# Patient Record
Sex: Female | Born: 1980 | Race: Black or African American | Hispanic: No | Marital: Single | Smoking: Never smoker
Health system: Southern US, Community
[De-identification: ages and names within clinical notes are randomized; demographics above are authoritative.]

## PROBLEM LIST (undated history)

## (undated) DIAGNOSIS — R32 Unspecified urinary incontinence: Secondary | ICD-10-CM

## (undated) HISTORY — DX: Unspecified urinary incontinence: R32

---

## 2011-06-22 DIAGNOSIS — R51 Headache: Secondary | ICD-10-CM | POA: Diagnosis not present

## 2011-10-05 DIAGNOSIS — R32 Unspecified urinary incontinence: Secondary | ICD-10-CM | POA: Diagnosis not present

## 2011-10-05 DIAGNOSIS — K5900 Constipation, unspecified: Secondary | ICD-10-CM | POA: Diagnosis not present

## 2011-10-05 DIAGNOSIS — F840 Autistic disorder: Secondary | ICD-10-CM | POA: Diagnosis not present

## 2012-04-11 DIAGNOSIS — F840 Autistic disorder: Secondary | ICD-10-CM | POA: Diagnosis not present

## 2012-04-11 DIAGNOSIS — K5900 Constipation, unspecified: Secondary | ICD-10-CM | POA: Diagnosis not present

## 2012-04-11 DIAGNOSIS — Z23 Encounter for immunization: Secondary | ICD-10-CM | POA: Diagnosis not present

## 2012-04-11 DIAGNOSIS — Z0000 Encounter for general adult medical examination without abnormal findings: Secondary | ICD-10-CM | POA: Diagnosis not present

## 2013-11-19 ENCOUNTER — Ambulatory Visit
Admission: RE | Admit: 2013-11-19 | Discharge: 2013-11-19 | Disposition: A | Discharge status: Home / Self Care | Payer: Medicare Other | Source: Ambulatory Visit | Attending: Internal Medicine | Admitting: Internal Medicine

## 2013-11-19 ENCOUNTER — Other Ambulatory Visit: Payer: Self-pay | Admitting: Internal Medicine

## 2013-11-19 DIAGNOSIS — R05 Cough: Secondary | ICD-10-CM

## 2013-11-19 DIAGNOSIS — R059 Cough, unspecified: Secondary | ICD-10-CM | POA: Diagnosis not present

## 2013-11-19 DIAGNOSIS — J189 Pneumonia, unspecified organism: Secondary | ICD-10-CM | POA: Diagnosis not present

## 2013-11-28 DIAGNOSIS — R1310 Dysphagia, unspecified: Secondary | ICD-10-CM | POA: Diagnosis not present

## 2013-11-28 DIAGNOSIS — J189 Pneumonia, unspecified organism: Secondary | ICD-10-CM | POA: Diagnosis not present

## 2013-12-05 ENCOUNTER — Other Ambulatory Visit (HOSPITAL_COMMUNITY): Payer: Self-pay | Admitting: Internal Medicine

## 2013-12-05 DIAGNOSIS — R1310 Dysphagia, unspecified: Secondary | ICD-10-CM

## 2013-12-06 ENCOUNTER — Other Ambulatory Visit: Payer: Self-pay | Admitting: Internal Medicine

## 2013-12-06 ENCOUNTER — Ambulatory Visit
Admission: RE | Admit: 2013-12-06 | Discharge: 2013-12-06 | Disposition: A | Discharge status: Home / Self Care | Payer: Medicare Other | Source: Ambulatory Visit | Attending: Internal Medicine | Admitting: Internal Medicine

## 2013-12-06 DIAGNOSIS — J189 Pneumonia, unspecified organism: Secondary | ICD-10-CM | POA: Diagnosis not present

## 2013-12-06 DIAGNOSIS — R059 Cough, unspecified: Secondary | ICD-10-CM | POA: Diagnosis not present

## 2013-12-06 DIAGNOSIS — J90 Pleural effusion, not elsewhere classified: Secondary | ICD-10-CM | POA: Diagnosis not present

## 2013-12-06 DIAGNOSIS — R05 Cough: Secondary | ICD-10-CM

## 2013-12-13 ENCOUNTER — Ambulatory Visit (HOSPITAL_COMMUNITY)
Admission: RE | Admit: 2013-12-13 | Discharge: 2013-12-13 | Disposition: A | Discharge status: Home / Self Care | Payer: Medicare Other | Source: Ambulatory Visit | Attending: Internal Medicine | Admitting: Internal Medicine

## 2013-12-13 DIAGNOSIS — R1310 Dysphagia, unspecified: Secondary | ICD-10-CM

## 2013-12-13 NOTE — Procedures (Signed)
Objective Swallowing Evaluation: Modified Barium Swallowing Study  Patient Details  Name: Jacqueline Hebert MRN: [id number] Date of Birth: [birth year]  Today's Date: 12/13/2013 Time: 1300-1326 SLP Time Calculation (min): 26 min  Past Medical History: No past medical history on file. Past Surgical History: No past surgical history on file. HPI:  33 yo female with h/o Autism, MR, urinary incontinence presenting for MBS due to concerns pt may be aspirating.  Pt has h/o right lower lobe pna on recent CXR.  Adoptive mother *grandmother present and reports pt takes medications crushed with applesauce and doesn't chew food well but does not normally cough when eating/drinking.  She admits to pt having congested cough recently.  Pt without significant weight loss and can normally feed herself.  She was observed to ambulate to and from xray suite.      Assessment / Plan / Recommendation Clinical Impression  Dysphagia Diagnosis: Mild oral phase dysphagia  Clinical impression: Mild oral phase dysphagia pt with decreased mastication abilities with pt appearing to suckle cracker without fully masticating.   In addition, pt partially masticated barium tablet that was given with pudding.  Pharyngeal stasis of piece of tablet noted in pyriform sinus that effectively cleared with slp providing pt with more pudding to swallow.  Pt did not aspirate/penetrate with any consistency swallowed and overall swallow was strong/timely.   Pt is impulsive and takes large boluses but was protective of her airway.    Educated pt's adoptive mother/caregiver to findings and strategies to maximize pt's airway protection due to episodic aspiration risk with oral deficits.   Potential diet modifications (soft) also reviewed.  Thanks for this referral.        Treatment Recommendation   No further SLP   Diet Recommendation Dysphagia 3 (Mechanical Soft);Thin liquid   Liquid Administration via: Cup Medication Administration: Crushed  with puree Supervision: Patient able to self feed Compensations: Slow rate;Small sips/bites;Follow solids with liquid Postural Changes and/or Swallow Maneuvers: Seated upright 90 degrees;Upright 30-60 min after meal    Other  Recommendations Oral Care Recommendations: Oral care BID   Follow Up Recommendations  None      General Date of Onset: 12/13/13 HPI: 33 yo female with h/o Autism, MR, urinary incontinence presenting for MBS due to concerns pt may be aspirating.  Pt has h/o right lower lobe pna on recent CXR.  Adoptive mother *grandmother present and reports pt takes medications crushed with applesauce and doesn't chew food well but does not normally cough when eating/drinking.  She admits to pt having congested cough recently.  Pt without significant weight loss and can normally feed herself.  She was observed to ambulate to and from xray suite.  Type of Study: Modified Barium Swallowing Study Reason for Referral: Objectively evaluate swallowing function Diet Prior to this Study: Regular;Thin liquids Temperature Spikes Noted: No Respiratory Status: Room air History of Recent Intubation: No Behavior/Cognition: Alert;Doesn't follow directions (pt has MR and did not follow directions for OME, dentition intact) Oral Cavity - Dentition: Adequate natural dentition Oral Motor / Sensory Function: Within functional limits Self-Feeding Abilities: Able to feed self Patient Positioning: Upright in chair Baseline Vocal Quality: Aphonic (adoptive mother 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) Volitional Cough: Cognitively unable to elicit Volitional Swallow: Unable to elicit Anatomy: Within functional limits Pharyngeal Secretions: Not observed secondary MBS    Reason for Referral Objectively evaluate swallowing function   Oral Phase Oral Preparation/Oral Phase Oral Phase: Impaired Oral - Nectar Oral - Nectar Cup: Within functional limits Oral -  Thin Oral - Thin Cup: Within functional limits Oral - Thin  Straw: Within functional limits Oral - Solids Oral - Puree: Within functional limits Oral - Regular: Impaired mastication Oral - Pill: Piecemeal swallowing;Weak lingual manipulation Oral Phase - Comment Oral Phase - Comment: pt with decreased mastication abilities, appearing to suckle cracker without full mastication, partially masticated barium tablet given with pudding before swallowing   Pharyngeal Phase Pharyngeal Phase Pharyngeal Phase: Impaired Pharyngeal - Nectar Pharyngeal - Nectar Cup: Within functional limits Pharyngeal - Thin Pharyngeal - Thin Cup: Within functional limits Pharyngeal - Thin Straw: Within functional limits Pharyngeal - Solids Pharyngeal - Puree: Within functional limits Pharyngeal - Regular: Premature spillage to valleculae;Within functional limits Pharyngeal - Pill: Pharyngeal residue - pyriform sinuses;Premature spillage to valleculae;Within functional limits Pharyngeal Phase - Comment Pharyngeal Comment: partially masticated barium tablet residuals with pudding noted to be retained in pyriform sinus and below UES without awareness, providing applesauce aided clearance  Cervical Esophageal Phase    GO    Cervical Esophageal Phase Cervical Esophageal Phase: Impaired Cervical Esophageal Phase - Comment Cervical Esophageal Comment: pt appeared with minimal amount of stasis below UES with pudding and barium tablet, swallow of thin barium effective to aid clearance     Functional Assessment Tool Used: mbs, clinical judgement Functional Limitations: Swallowing Swallow Current Status ([id number]): At least 1 percent but less than 20 percent impaired, limited or restricted Swallow Goal Status [phone]): At least 1 percent but less than 20 percent impaired, limited or restricted Swallow Discharge Status [phone]): At least 1 percent but less than 20 percent impaired, limited or restricted    Claudie Fisherman, [facility] SLP [phone]

## 2014-01-01 ENCOUNTER — Ambulatory Visit: Payer: Medicare Other

## 2014-04-16 ENCOUNTER — Other Ambulatory Visit: Payer: Self-pay | Admitting: Nurse Practitioner

## 2014-04-16 ENCOUNTER — Ambulatory Visit
Admission: RE | Admit: 2014-04-16 | Discharge: 2014-04-16 | Disposition: A | Discharge status: Home / Self Care | Payer: Medicare Other | Source: Ambulatory Visit | Attending: Nurse Practitioner | Admitting: Nurse Practitioner

## 2014-04-16 DIAGNOSIS — M79671 Pain in right foot: Secondary | ICD-10-CM

## 2014-04-16 DIAGNOSIS — M7989 Other specified soft tissue disorders: Secondary | ICD-10-CM | POA: Diagnosis not present

## 2014-05-26 DIAGNOSIS — J3489 Other specified disorders of nose and nasal sinuses: Secondary | ICD-10-CM | POA: Diagnosis not present

## 2014-06-02 DIAGNOSIS — J3489 Other specified disorders of nose and nasal sinuses: Secondary | ICD-10-CM | POA: Diagnosis not present

## 2014-06-02 DIAGNOSIS — D2239 Melanocytic nevi of other parts of face: Secondary | ICD-10-CM | POA: Diagnosis not present

## 2014-07-04 DIAGNOSIS — R1310 Dysphagia, unspecified: Secondary | ICD-10-CM | POA: Diagnosis not present

## 2014-07-04 DIAGNOSIS — Z0001 Encounter for general adult medical examination with abnormal findings: Secondary | ICD-10-CM | POA: Diagnosis not present

## 2014-07-04 DIAGNOSIS — Z1389 Encounter for screening for other disorder: Secondary | ICD-10-CM | POA: Diagnosis not present

## 2014-07-04 DIAGNOSIS — K5900 Constipation, unspecified: Secondary | ICD-10-CM | POA: Diagnosis not present

## 2014-07-04 DIAGNOSIS — R32 Unspecified urinary incontinence: Secondary | ICD-10-CM | POA: Diagnosis not present

## 2014-07-04 DIAGNOSIS — F840 Autistic disorder: Secondary | ICD-10-CM | POA: Diagnosis not present

## 2015-01-05 ENCOUNTER — Ambulatory Visit: Payer: Medicare Other | Admitting: Internal Medicine

## 2015-02-05 ENCOUNTER — Ambulatory Visit (INDEPENDENT_AMBULATORY_CARE_PROVIDER_SITE_OTHER): Payer: Medicare Other | Admitting: Internal Medicine

## 2015-02-05 ENCOUNTER — Encounter: Payer: Self-pay | Admitting: Internal Medicine

## 2015-02-05 VITALS — BP 122/78 | HR 79 | Temp 97.8°F | Resp 12 | Ht 62.0 in | Wt 119.1 lb

## 2015-02-05 DIAGNOSIS — Z0000 Encounter for general adult medical examination without abnormal findings: Secondary | ICD-10-CM | POA: Insufficient documentation

## 2015-02-05 DIAGNOSIS — Z23 Encounter for immunization: Secondary | ICD-10-CM

## 2015-02-05 DIAGNOSIS — F79 Unspecified intellectual disabilities: Secondary | ICD-10-CM | POA: Insufficient documentation

## 2015-02-05 NOTE — Progress Notes (Signed)
Pre visit review using our clinic review tool, if applicable. No additional management support is needed unless otherwise documented below in the visit note. 

## 2015-02-05 NOTE — Progress Notes (Signed)
   Subjective:    Patient ID: LEO WEYANDT, female    DOB: Aug 23, 1980, 34 y.o.   MRN: [id number]  HPI The patient is a new 34 YO female coming in for wellness. She does have PMH of mental impairment and does not speak. She is living with her legal guardian. Not able to provide history but no concerns from her caregiver.  PMH, [facility], social history reviewed and updated.   Review of Systems  Unable to perform ROS: Patient nonverbal      Objective:   Physical Exam  Constitutional: She appears well-developed and well-nourished. No distress.  HENT:  Head: Normocephalic and atraumatic.  Eyes: EOM are normal.  Neck: Normal range of motion.  Cardiovascular: Normal rate and regular rhythm.   Pulmonary/Chest: Effort normal and breath sounds normal. No respiratory distress. She has no wheezes.  Abdominal: Soft. Bowel sounds are normal. She exhibits no distension. There is no tenderness.  Neurological:  Does not follow commands, non-verbal  Skin: Skin is warm and dry.   Filed Vitals:   02/05/15 1050  BP: 122/78  Pulse: 79  Temp: 97.8 F (36.6 C)  TempSrc: Oral  Resp: 12  Height: 5\' 2"  (1.575 m)  Weight: 119 lb 1.3 oz (54.014 kg)  SpO2: 98%      Assessment & Plan:  Flu shot given at the visit.

## 2015-02-05 NOTE — Patient Instructions (Signed)
We have given you the flu shot today and can see you back in about 6 months.

## 2015-02-05 NOTE — Assessment & Plan Note (Signed)
Non-verbal although her caregiver is able to understand if something is bothering her.

## 2015-02-05 NOTE — Assessment & Plan Note (Signed)
Pap smears are traumatic and has had at least 1 normal in the past. Not sexually active. Reminded about sunscreen when outdoors and working on activity daily. Will get records of labs from prior provider and no labs today. Flu shot given today.

## 2015-02-11 IMAGING — CR DG CHEST 2V
2 series · 2 of 2 positions shown · non-contrast
Comparison: None.

CLINICAL DATA: Cough, fever, chest congestion

EXAM:
CHEST  2 VIEW

[view not recorded (1 of 2)]
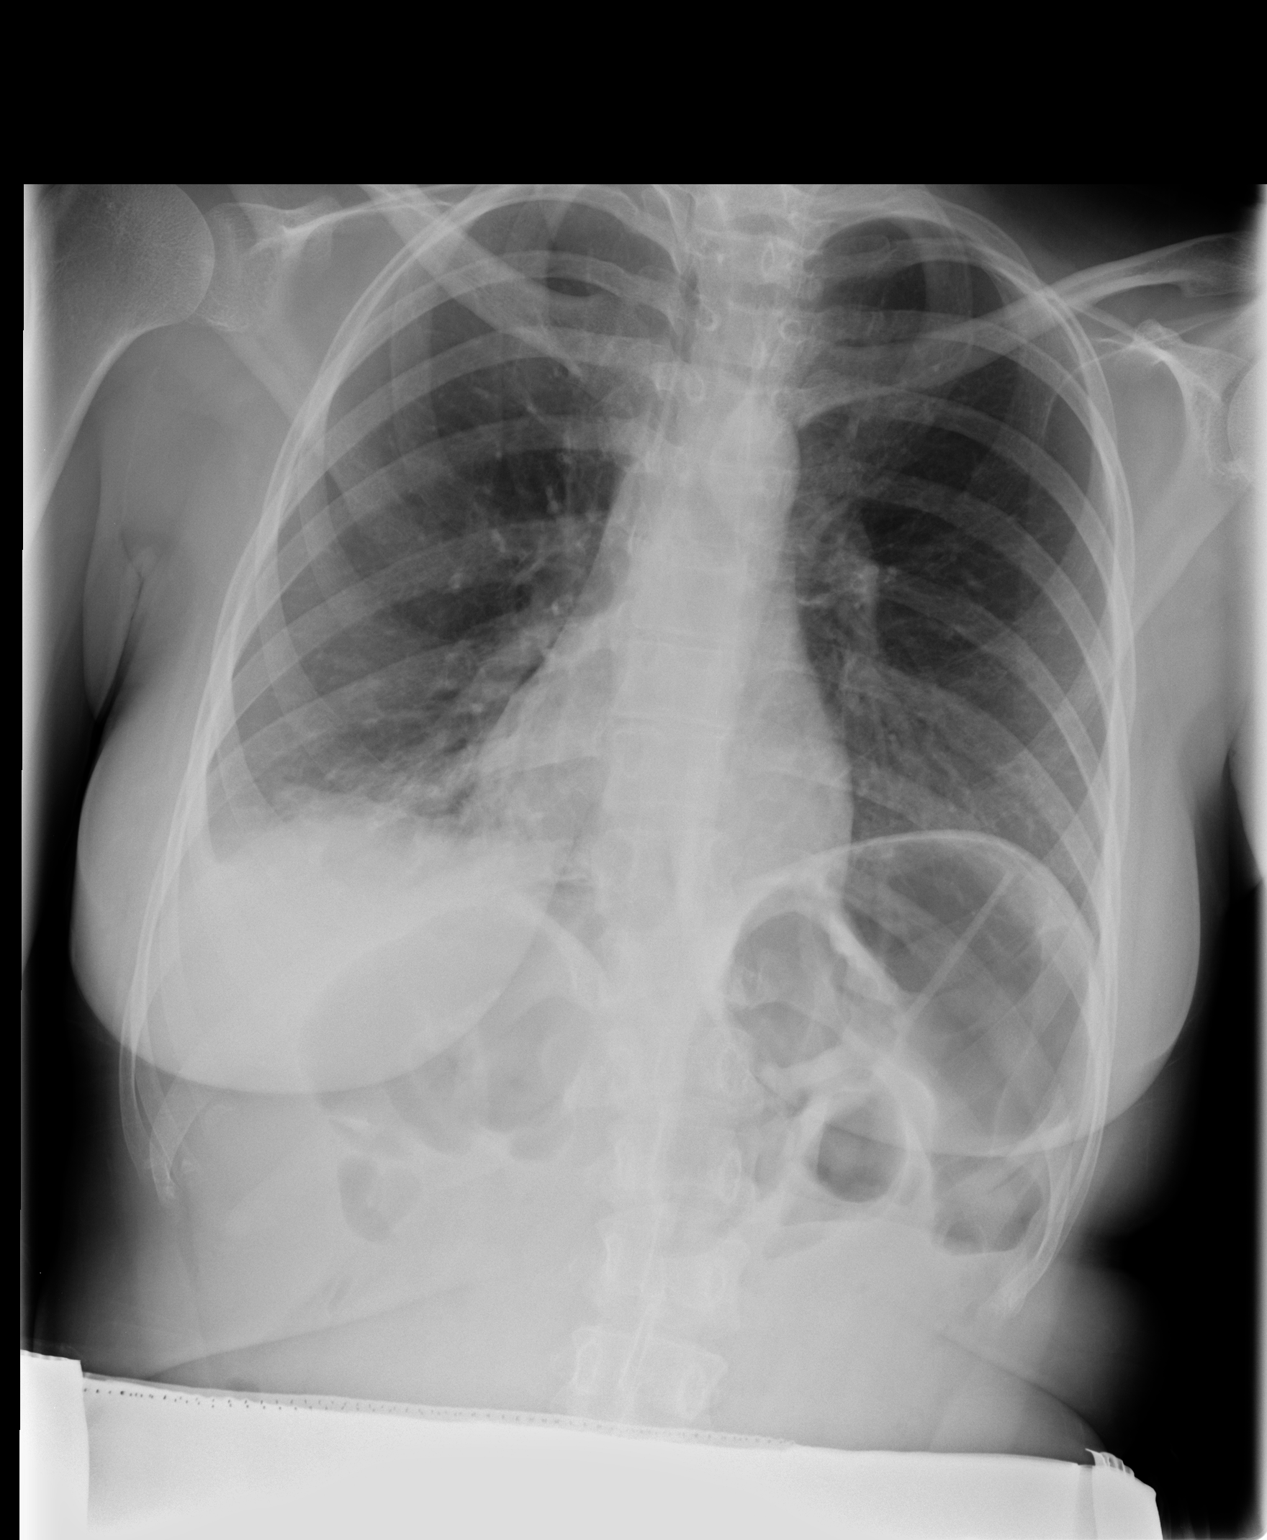

[view not recorded (2 of 2)]
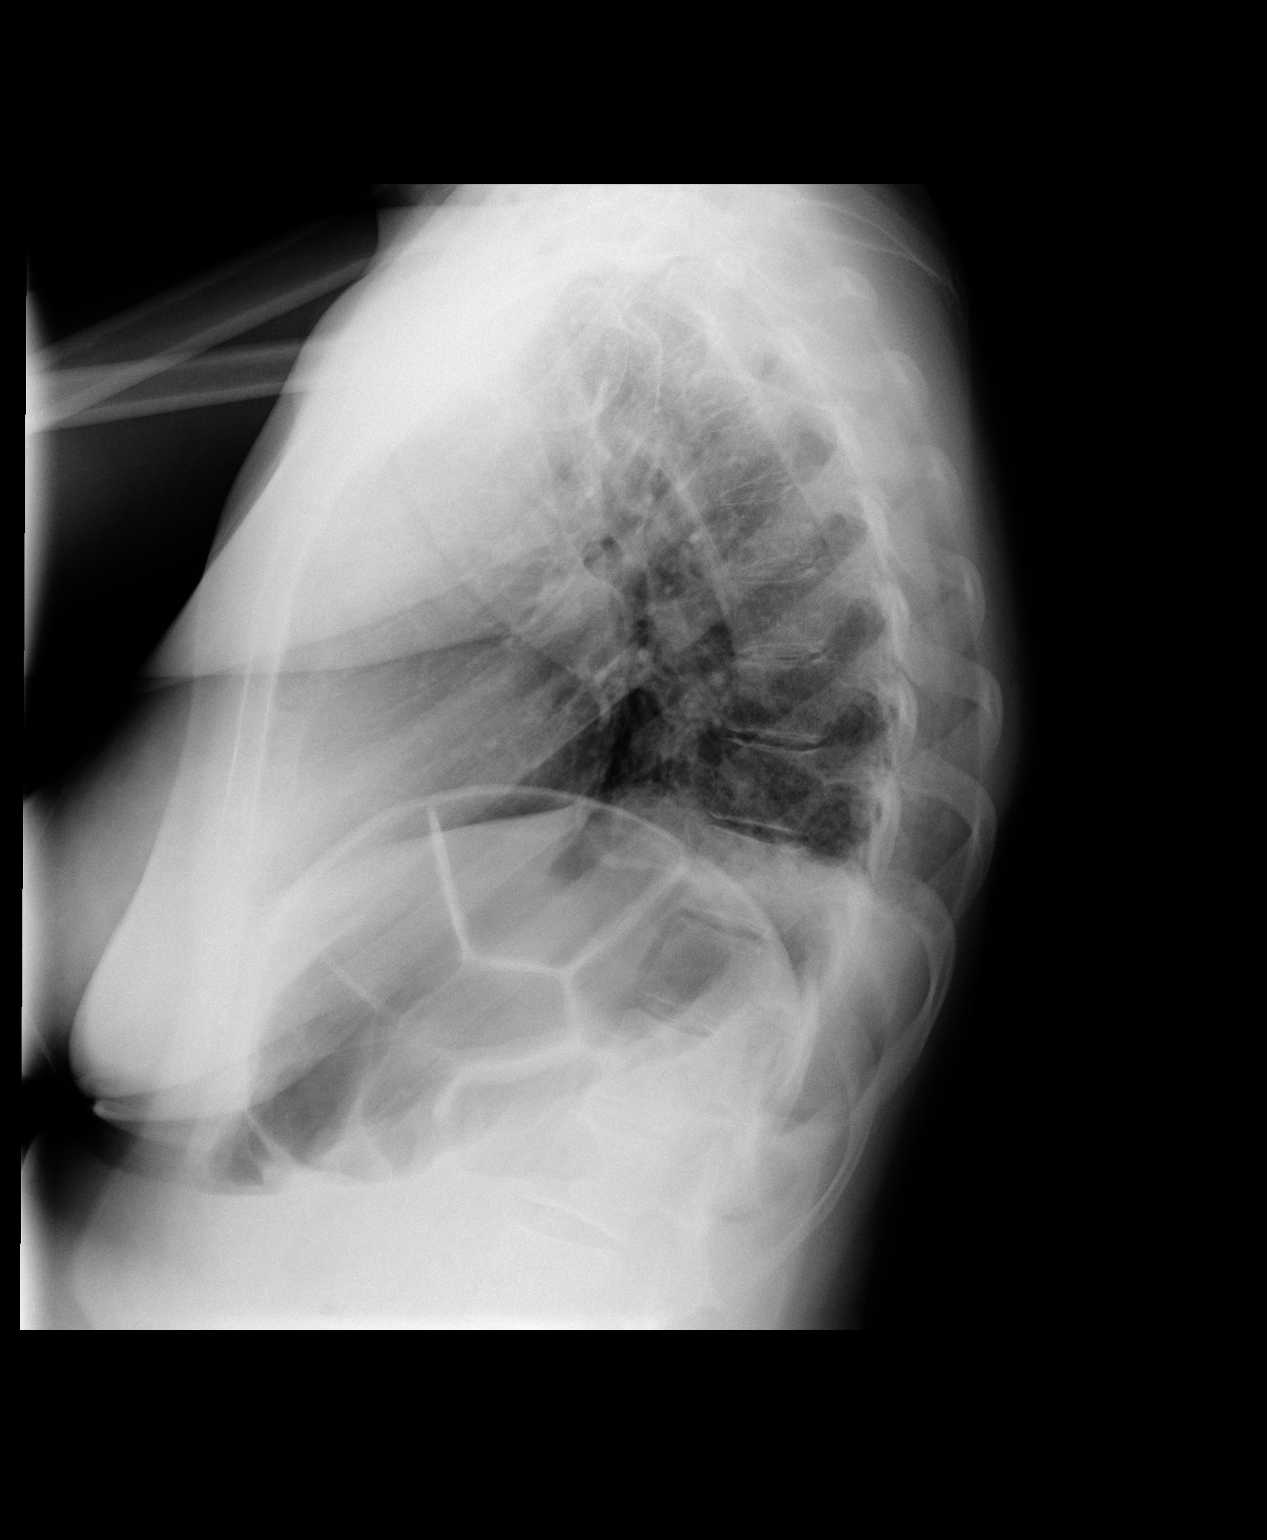

[2 of 2 positions shown; findings below may reference images not displayed]

FINDINGS: There is parenchymal opacity deep at the right lung base suspicious
for pneumonia and possibly a small right pleural effusion. The left
lung is clear. Mediastinal contours appear normal. The heart is
within normal limits in size.
IMPRESSION: Opacity in the right lower lobe consistent with pneumonia and
possible small right pleural effusion.

## 2015-04-07 ENCOUNTER — Telehealth: Payer: Self-pay

## 2015-04-07 NOTE — Telephone Encounter (Signed)
Call back from mother of patient to schedule for AWV; Jayni is on Medicare as well; Will fup on AWV for [name]; POA is in place;  May be review for needs and LTC planning/ will hold tentative apt for Sha[place]rea on Friday 1/11 at 11am

## 2015-04-10 ENCOUNTER — Ambulatory Visit (INDEPENDENT_AMBULATORY_CARE_PROVIDER_SITE_OTHER): Payer: Medicare Other

## 2015-04-10 ENCOUNTER — Ambulatory Visit: Payer: Medicare Other

## 2015-04-10 VITALS — BP 120/80 | HR 95 | Temp 98.2°F | Ht 63.0 in | Wt 121.5 lb

## 2015-04-10 DIAGNOSIS — Z0000 Encounter for general adult medical examination without abnormal findings: Secondary | ICD-10-CM

## 2015-04-10 NOTE — Progress Notes (Signed)
Subjective:   Jacqueline Hebert is a 34 y.o. female who presents for Medicare Annual (INITIAL)  preventive examination.  Review of Systems:     HRA assessment completed during visit; Jacqueline Hebert The Patient was informed that this wellness visit is to identify risk and educate on how to reduce risk for increase disease through lifestyle changes.   ROS deferred to CPE exam with physician/ Changing care to Dr. Amedeo Hebert and legal guardian is Jacqueline Hebert; AD on file Jacqueline. Jacqueline Hebert has permission to speak for her dtr who is developmentally disabled and cannot communicate.    Medical issues  BMI: 21.4 Diet;  Jacqueline Hebert will come in the kitchen and sit at the table when she is hunger Mother Cooks meal for her; some type of vegetable; baked potato; hamburgers; chicken;  Fruits; apples; oranges; Jacqueline Hebert eats well;  Exercise; she has play items at home; go to Jacqueline Hebert recreation; loves to walk Recommended the [employer] and the mother will call to discern activities that Jacqueline Hebert could enjoy; Also to stimulate socialization   SAFETY is an issue; had to put fence around the house; Neighbor has pit bull  Not the safest area but home is purchased / can't use deck because the dogs dig under the fence;  Afraid of neighbor's dogs; hx of attempts to break in; Adult day for DD is not appropriate with history Advised to call the police if neighbor's dog crosses the year or digs under her fence;  Will also outreach the [facility] for any legal assistance that may be helpful  Is across the hall from mother; sleeps by herself;  Routine; Jacqueline Hebert will wake Jacqueline Hebert up; she knows to go to bathroom herself; Dependent on all ADLs and IADL's  No hx of falls  Safety reviewed for the home; clear paths through the home, bathroom safety is an issue; ; smoke detectors in the home; firearms safety discussed and to any firearms  locked up;  Driving accidents / not driving accidents Sun protection advised when out   Medication review/ no  new meds; takes miralax as needed  Fall assessment no falls   Mobilization and Functional losses in the last year./ no Sleep patterns/ sleeps good; but will wake up in the early am.   Long term Plan  is that Jacqueline Hebert would be cared for by [name], her sister when and if Mrs. Cast is not able to provider care Dtr  Lives approx 10 minutes apart Moved From [place] in 2006;    Urinary or fecal incontinence reviewed/ no issues   Counseling: Colonoscopy; to early;  EKG: no record Hearing: unable to determine Dexa/ not due Mammagram/ not able to do this; could not tolerate Ophthalmology exam; no eye exam recently due to not being able to tolerate exam Cervical cancer screening not completed because Jacqueline Hebert could not tolerate Lives with mother and is not sexually active   Immunizations Due  Tetanus unspecified; Home aid  stepped on [name]'s foot and she was taken to the doctor and given a TD this year. It was during the summer, but is not sure which she had TD or Tdap.     Current Care Team reviewed and updated [facility]         Objective:     Vitals: BP 120/80 mmHg  Pulse 95  Temp(Src) 98.2 F (36.8 C) (Oral)  Ht 5\' 3"  (1.6 m)  Wt 121 lb 8 oz (55.112 kg)  BMI 21.53 kg/m2  SpO2  99%  Tobacco History  Smoking status  . Never Smoker   Smokeless tobacco  . Not on file     Counseling given: Yes   Past Medical History  Diagnosis Date  . Incontinence of urine    No past surgical history on file. Family History  Problem Relation Age of Onset  . Family history unknown: Yes   History  Sexual Activity  . Sexual Activity: Not on file    Outpatient Encounter Prescriptions as of 04/10/2015  Medication Sig  . polyethylene glycol powder (GLYCOLAX/MIRALAX) powder Take by mouth daily.    No facility-administered encounter medications on file as of 04/10/2015.    Activities of Daily Living In your present state of health, do you have any difficulty  performing the following activities: 04/10/2015  Hearing? N  Vision? N  Difficulty concentrating or making decisions? Y  Walking or climbing stairs? Y  Dressing or bathing? Y  Doing errands, shopping? Y  Preparing Food and eating ? Y  Using the Toilet? Y  In the past six months, have you accidently leaked urine? Y  Do you have problems with loss of bowel control? Y  Managing your Medications? Y  Managing your Finances? Y  Housekeeping or managing your Housekeeping? Y    Patient Care Team: [name], MD as PCP - General (Internal Medicine)    Assessment:    Assessment   Patient presents for yearly preventative medicine examination. Medicare questionnaire screening were completed, i.e. Functional; fall risk; depression, memory loss and hearing. / is dependent in all ADLs.  All immunizations and health maintenance protocols were reviewed with the patient can not tolerate Pap or mammogram; Could not tolerate eye exam;   Education provided for laboratory screens;    Medication reconciliation, past medical history, social history, problem list and allergies were reviewed in detail with the patient/ referral to [facility] for advocacy and socialization  Goals were established with regard to the patient's individualized risk; In this case, the family in need of advocacy and resources.   End of life planning was discussed and has been completed with HCPOA in place as well a guardian ship    Exercise Activities and Dietary recommendations Current Exercise Habits:: Exercise is limited by, Limited by:: Other - see comments (DD; but likes to walk)  Goals    . patient     Dtr will provide care when Jacqueline. Pamer is no longer able;        Fall Risk Fall Risk  04/10/2015  Falls in the past year? No   Depression Screen PHQ 2/9 Scores 04/10/2015  PHQ - 2 Score 0   Mother states mood is good all the time; Very seldom is tearful, agitated, angry; or has inappropriate  behavior  Cognitive Testing MMSE - Mini Mental State Exam 04/10/2015  Not completed: (No Data)  not able to perform  Immunization History  Administered Date(s) Administered  . Influenza,inj,Quad PF,36+ Mos 02/05/2015  . Tetanus 11/28/2014   Screening Tests Health Maintenance  Topic Date Due  . HIV Screening  01/25/1994  . TETANUS/TDAP  01/25/1998  . PAP SMEAR  01/26/2000  . INFLUENZA VACCINE  12/29/2015      Plan:   [facility] [employer] [facility] [place]; [address] [phone] [email or web address] Mentioned day care services or socialization for [facility]  CAP; is on waiting list  Will check on Anelisse' vaccinations;    During the course of the visit the patient was educated and counseled about the following appropriate  screening and preventive services:   Vaccines to include Pneumoccal, Influenza, Hepatitis B, Td, Zostavax, HCV/  To fup with dr. Delfina Redwood for specific's   Electrocardiogram/ deferred  Cardiovascular Disease/ BP was low but improving  Colorectal cancer screening/ not age appropirate  Bone density screening/ deferred  Diabetes screening/ deferred  Glaucoma screening/ eye exam deferred due to poor tolerance  Mammography/PAP deferred due to poor tolerance  Nutrition counseling / eats well per the mother; fixes meals with vegetables; hamburgers and other favorite foods; BMI appropriate  Patient Instructions (the written plan) was given to the patient.   Wynetta Fines, RN  04/10/2015

## 2015-04-10 NOTE — Patient Instructions (Addendum)
Jacqueline Hebert , Thank you for taking time to come for your Medicare Wellness Visit. I appreciate your ongoing commitment to your health goals. Please review the following plan we discussed and let me know if I can assist you in the future.   [employer] [place]; [address] [phone] [email or web address] Mentioned day care services or socialization for [facility]  CAP; is on waiting list  Will check on Jacqueline Hebert' vaccinations;    These are the goals we discussed: Goals    . patient     Dtr will provide care when Jacqueline Hebert is no longer able;         This is a list of the screening recommended for you and due dates:  Health Maintenance  Topic Date Due  . HIV Screening  01/25/1994  . Tetanus Vaccine  01/25/1998  . Pap Smear  01/26/2000  . Flu Shot  12/29/2015     Fall Prevention in the Home  Falls can cause injuries. They can happen to people of all ages. There are many things you can do to make your home safe and to help prevent falls.  WHAT CAN I DO ON THE OUTSIDE OF MY HOME?  Regularly fix the edges of walkways and driveways and fix any cracks.  Remove anything that might make you trip as you walk through a door, such as a raised step or threshold.  Trim any bushes or trees on the path to your home.  Use bright outdoor lighting.  Clear any walking paths of anything that might make someone trip, such as rocks or tools.  Regularly check to see if handrails are loose or broken. Make sure that both sides of any steps have handrails.  Any raised decks and porches should have guardrails on the edges.  Have any leaves, snow, or ice cleared regularly.  Use sand or salt on walking paths during winter.  Clean up any spills in your garage right away. This includes oil or grease spills. WHAT CAN I DO IN THE BATHROOM?   Use night lights.  Install grab bars by the toilet and in the tub and shower. Do not use towel bars as grab bars.  Use non-skid mats or decals in the tub or  shower.  If you need to sit down in the shower, use a plastic, non-slip stool.  Keep the floor dry. Clean up any water that spills on the floor as soon as it happens.  Remove soap buildup in the tub or shower regularly.  Attach bath mats securely with double-sided non-slip rug tape.  Do not have throw rugs and other things on the floor that can make you trip. WHAT CAN I DO IN THE BEDROOM?  Use night lights.  Make sure that you have a light by your bed that is easy to reach.  Do not use any sheets or blankets that are too big for your bed. They should not hang down onto the floor.  Have a firm chair that has side arms. You can use this for support while you get dressed.  Do not have throw rugs and other things on the floor that can make you trip. WHAT CAN I DO IN THE KITCHEN?  Clean up any spills right away.  Avoid walking on wet floors.  Keep items that you use a lot in easy-to-reach places.  If you need to reach something above you, use a strong step stool that has a grab bar.  Keep electrical cords out  of the way.  Do not use floor polish or wax that makes floors slippery. If you must use wax, use non-skid floor wax.  Do not have throw rugs and other things on the floor that can make you trip. WHAT CAN I DO WITH MY STAIRS?  Do not leave any items on the stairs.  Make sure that there are handrails on both sides of the stairs and use them. Fix handrails that are broken or loose. Make sure that handrails are as long as the stairways.  Check any carpeting to make sure that it is firmly attached to the stairs. Fix any carpet that is loose or worn.  Avoid having throw rugs at the top or bottom of the stairs. If you do have throw rugs, attach them to the floor with carpet tape.  Make sure that you have a light switch at the top of the stairs and the bottom of the stairs. If you do not have them, ask someone to add them for you. WHAT ELSE CAN I DO TO HELP PREVENT  FALLS?  Wear shoes that:  Do not have high heels.  Have rubber bottoms.  Are comfortable and fit you well.  Are closed at the toe. Do not wear sandals.  If you use a stepladder:  Make sure that it is fully opened. Do not climb a closed stepladder.  Make sure that both sides of the stepladder are locked into place.  Ask someone to hold it for you, if possible.  Clearly mark and make sure that you can see:  Any grab bars or handrails.  First and last steps.  Where the edge of each step is.  Use tools that help you move around (mobility aids) if they are needed. These include:  Canes.  Walkers.  Scooters.  Crutches.  Turn on the lights when you go into a dark area. Replace any light bulbs as soon as they burn out.  Set up your furniture so you have a clear path. Avoid moving your furniture around.  If any of your floors are uneven, fix them.  If there are any pets around you, be aware of where they are.  Review your medicines with your doctor. Some medicines can make you feel dizzy. This can increase your chance of falling. Ask your doctor what other things that you can do to help prevent falls.   This information is not intended to replace advice given to you by your health care provider. Make sure you discuss any questions you have with your health care provider.   Document Released: 03/12/2009 Document Revised: 09/30/2014 Document Reviewed: 06/20/2014 Elsevier Interactive Patient Education 2016 [place] Maintenance, Female Adopting a healthy lifestyle and getting preventive care can go a long way to promote health and wellness. Talk with your health care provider about what schedule of regular examinations is right for you. This is a good chance for you to check in with your provider about disease prevention and staying healthy. In between checkups, there are plenty of things you can do on your own. Experts have done a lot of research about which  lifestyle changes and preventive measures are most likely to keep you healthy. Ask your health care provider for more information. WEIGHT AND DIET  Eat a healthy diet  Be sure to include plenty of vegetables, fruits, low-fat dairy products, and lean protein.  Do not eat a lot of foods high in solid fats, added sugars, or salt.  Get regular  exercise. This is one of the most important things you can do for your health.  Most adults should exercise for at least 150 minutes each week. The exercise should increase your heart rate and make you sweat (moderate-intensity exercise).  Most adults should also do strengthening exercises at least twice a week. This is in addition to the moderate-intensity exercise.  Maintain a healthy weight  Body mass index (BMI) is a measurement that can be used to identify possible weight problems. It estimates body fat based on height and weight. Your health care provider can help determine your BMI and help you achieve or maintain a healthy weight.  For females 25 years of age and older:   A BMI below 18.5 is considered underweight.  A BMI of 18.5 to 24.9 is normal.  A BMI of 25 to 29.9 is considered overweight.  A BMI of 30 and above is considered obese.  Watch levels of cholesterol and blood lipids  You should start having your blood tested for lipids and cholesterol at 36 years of age, then have this test every 5 years.  You may need to have your cholesterol levels checked more often if:  Your lipid or cholesterol levels are high.  You are older than 36 years of age.  You are at high risk for heart disease.  CANCER SCREENING   Lung Cancer  Lung cancer screening is recommended for adults 51-58 years old who are at high risk for lung cancer because of a history of smoking.  A yearly low-dose CT scan of the lungs is recommended for people who:  Currently smoke.  Have quit within the past 15 years.  Have at least a 30-pack-year history of  smoking. A pack year is smoking an average of one pack of cigarettes a day for 1 year.  Yearly screening should continue until it has been 15 years since you quit.  Yearly screening should stop if you develop a health problem that would prevent you from having lung cancer treatment.  Breast Cancer  Practice breast self-awareness. This means understanding how your breasts normally appear and feel.  It also means doing regular breast self-exams. Let your health care provider know about any changes, no matter how small.  If you are in your 20s or 30s, you should have a clinical breast exam (CBE) by a health care provider every 1-3 years as part of a regular health exam.  If you are 90 or older, have a CBE every year. Also consider having a breast X-ray (mammogram) every year.  If you have a family history of breast cancer, talk to your health care provider about genetic screening.  If you are at high risk for breast cancer, talk to your health care provider about having an MRI and a mammogram every year.  Breast cancer gene (BRCA) assessment is recommended for women who have family members with BRCA-related cancers. BRCA-related cancers include:  Breast.  Ovarian.  Tubal.  Peritoneal cancers.  Results of the assessment will determine the need for genetic counseling and BRCA1 and BRCA2 testing. Cervical Cancer Your health care provider may recommend that you be screened regularly for cancer of the pelvic organs (ovaries, uterus, and vagina). This screening involves a pelvic examination, including checking for microscopic changes to the surface of your cervix (Pap test). You may be encouraged to have this screening done every 3 years, beginning at age 38.  For women ages 47-65, health care providers may recommend pelvic exams and Pap testing  every 3 years, or they may recommend the Pap and pelvic exam, combined with testing for human papilloma virus (HPV), every 5 years. Some types of  HPV increase your risk of cervical cancer. Testing for HPV may also be done on women of any age with unclear Pap test results.  Other health care providers may not recommend any screening for nonpregnant women who are considered low risk for pelvic cancer and who do not have symptoms. Ask your health care provider if a screening pelvic exam is right for you.  If you have had past treatment for cervical cancer or a condition that could lead to cancer, you need Pap tests and screening for cancer for at least 20 years after your treatment. If Pap tests have been discontinued, your risk factors (such as having a new sexual partner) need to be reassessed to determine if screening should resume. Some women have medical problems that increase the chance of getting cervical cancer. In these cases, your health care provider may recommend more frequent screening and Pap tests. Colorectal Cancer  This type of cancer can be detected and often prevented.  Routine colorectal cancer screening usually begins at 36 years of age and continues through 36 years of age.  Your health care provider may recommend screening at an earlier age if you have risk factors for colon cancer.  Your health care provider may also recommend using home test kits to check for hidden blood in the stool.  A small camera at the end of a tube can be used to examine your colon directly (sigmoidoscopy or colonoscopy). This is done to check for the earliest forms of colorectal cancer.  Routine screening usually begins at age 79.  Direct examination of the colon should be repeated every 5-10 years through 36 years of age. However, you may need to be screened more often if early forms of precancerous polyps or small growths are found. Skin Cancer  Check your skin from head to toe regularly.  Tell your health care provider about any new moles or changes in moles, especially if there is a change in a mole's shape or color.  Also tell your  health care provider if you have a mole that is larger than the size of a pencil eraser.  Always use sunscreen. Apply sunscreen liberally and repeatedly throughout the day.  Protect yourself by wearing long sleeves, pants, a wide-brimmed hat, and sunglasses whenever you are outside. HEART DISEASE, DIABETES, AND HIGH BLOOD PRESSURE   High blood pressure causes heart disease and increases the risk of stroke. High blood pressure is more likely to develop in:  People who have blood pressure in the high end of the normal range (130-139/85-89 mm Hg).  People who are overweight or obese.  People who are African American.  If you are 35-94 years of age, have your blood pressure checked every 3-5 years. If you are 63 years of age or older, have your blood pressure checked every year. You should have your blood pressure measured twice--once when you are at a hospital or clinic, and once when you are not at a hospital or clinic. Record the average of the two measurements. To check your blood pressure when you are not at a hospital or clinic, you can use:  An automated blood pressure machine at a pharmacy.  A home blood pressure monitor.  If you are between 41 years and 30 years old, ask your health care provider if you should take aspirin to prevent  strokes.  Have regular diabetes screenings. This involves taking a blood sample to check your fasting blood sugar level.  If you are at a normal weight and have a low risk for diabetes, have this test once every three years after 36 years of age.  If you are overweight and have a high risk for diabetes, consider being tested at a younger age or more often. PREVENTING INFECTION  Hepatitis B  If you have a higher risk for hepatitis B, you should be screened for this virus. You are considered at high risk for hepatitis B if:  You were born in a country where hepatitis B is common. Ask your health care provider which countries are considered high  risk.  Your parents were born in a high-risk country, and you have not been immunized against hepatitis B (hepatitis B vaccine).  You have HIV or AIDS.  You use needles to inject street drugs.  You live with someone who has hepatitis B.  You have had sex with someone who has hepatitis B.  You get hemodialysis treatment.  You take certain medicines for conditions, including cancer, organ transplantation, and autoimmune conditions. Hepatitis C  Blood testing is recommended for:  Everyone born from [birth year] through 1967.  Anyone with known risk factors for hepatitis C. Sexually transmitted infections (STIs)  You should be screened for sexually transmitted infections (STIs) including gonorrhea and chlamydia if:  You are sexually active and are younger than 36 years of age.  You are older than 36 years of age and your health care provider tells you that you are at risk for this type of infection.  Your sexual activity has changed since you were last screened and you are at an increased risk for chlamydia or gonorrhea. Ask your health care provider if you are at risk.  If you do not have HIV, but are at risk, it may be recommended that you take a prescription medicine daily to prevent HIV infection. This is called pre-exposure prophylaxis (PrEP). You are considered at risk if:  You are sexually active and do not regularly use condoms or know the HIV status of your partner(s).  You take drugs by injection.  You are sexually active with a partner who has HIV. Talk with your health care provider about whether you are at high risk of being infected with HIV. If you choose to begin PrEP, you should first be tested for HIV. You should then be tested every 3 months for as long as you are taking PrEP.  PREGNANCY   If you are premenopausal and you may become pregnant, ask your health care provider about preconception counseling.  If you may become pregnant, take 400 to 800 micrograms (mcg)  of folic acid every day.  If you want to prevent pregnancy, talk to your health care provider about birth control (contraception). OSTEOPOROSIS AND MENOPAUSE   Osteoporosis is a disease in which the bones lose minerals and strength with aging. This can result in serious bone fractures. Your risk for osteoporosis can be identified using a bone density scan.  If you are 67 years of age or older, or if you are at risk for osteoporosis and fractures, ask your health care provider if you should be screened.  Ask your health care provider whether you should take a calcium or vitamin D supplement to lower your risk for osteoporosis.  Menopause may have certain physical symptoms and risks.  Hormone replacement therapy may reduce some of these symptoms and risks.  Talk to your health care provider about whether hormone replacement therapy is right for you.  HOME CARE INSTRUCTIONS   Schedule regular health, dental, and eye exams.  Stay current with your immunizations.   Do not use any tobacco products including cigarettes, chewing tobacco, or electronic cigarettes.  If you are pregnant, do not drink alcohol.  If you are breastfeeding, limit how much and how often you drink alcohol.  Limit alcohol intake to no more than 1 drink per day for nonpregnant women. One drink equals 12 ounces of beer, 5 ounces of wine, or 1 ounces of hard liquor.  Do not use street drugs.  Do not share needles.  Ask your health care provider for help if you need support or information about quitting drugs.  Tell your health care provider if you often feel depressed.  Tell your health care provider if you have ever been abused or do not feel safe at home.   This information is not intended to replace advice given to you by your health care provider. Make sure you discuss any questions you have with your health care provider.   Document Released: 11/29/2010 Document Revised: 06/06/2014 Document Reviewed:  04/17/2013 Elsevier Interactive Patient Education [employer].

## 2015-04-10 NOTE — Progress Notes (Signed)
Medical screening examination/treatment/procedure(s) were performed by non-physician practitioner and as supervising physician I was immediately available for consultation/collaboration. I agree with above. [facility] Ozias Dicenzo, MD 

## 2015-05-13 ENCOUNTER — Telehealth: Payer: Self-pay

## 2015-05-13 NOTE — Telephone Encounter (Signed)
Rec'd fax from [facility];  Scanned to medical record  Flu vaccine; 04/16/2009; 04/05/2010;04/06/2011;04/11/2012;04/06/2013;04/16/2014 Recorded TD that was given 09/29/2005

## 2015-06-29 ENCOUNTER — Telehealth: Payer: Self-pay | Admitting: Internal Medicine

## 2015-06-29 NOTE — Telephone Encounter (Signed)
Pt mother called in and said that pt needs new referral for someone to come in and bath her.    Angels hand [phone]

## 2015-06-30 NOTE — Telephone Encounter (Signed)
Can you call and give verbal order?

## 2015-06-30 NOTE — Telephone Encounter (Signed)
Patient has had services in the past, but is not signed up for services now. She will need a new referral. A DMA 3051 form is needed for the referral and they are faxing me a copy of the form. You only have to fill out the first two pages, and that form serves as the referral.

## 2015-07-07 ENCOUNTER — Ambulatory Visit: Payer: Medicare Other

## 2015-08-05 ENCOUNTER — Other Ambulatory Visit: Payer: Self-pay | Admitting: Internal Medicine

## 2015-08-05 ENCOUNTER — Encounter: Payer: Medicare Other | Admitting: Internal Medicine

## 2015-08-05 MED ORDER — POLYETHYLENE GLYCOL 3350 17 GM/SCOOP PO POWD: 17.0000 g | Freq: Two times a day (BID) | ORAL | Status: DC | PRN

## 2015-08-11 ENCOUNTER — Telehealth: Payer: Self-pay | Admitting: *Deleted

## 2015-08-11 NOTE — Telephone Encounter (Signed)
Called [facility] to get order ldy states MD can fax over Rx for supplies, with quantity per month, dx,m along with insurance card to Att: Maudie Mercury [phone]...Jacqueline Hebert

## 2015-08-11 NOTE — Telephone Encounter (Signed)
Left msg on triage stating needing order for incontinent supplies call to [facility] [phone].Marland KitchenJohny Hebert

## 2015-08-11 NOTE — Telephone Encounter (Signed)
What quantity do they need per month?

## 2015-08-11 NOTE — Telephone Encounter (Signed)
Called pt mom no answer LMOM RTC w/ quantity per month...Jacqueline Hebert

## 2015-08-12 NOTE — Telephone Encounter (Signed)
Called pt mom again still no answer LMOM RTC...Johny Chess

## 2015-08-17 NOTE — Telephone Encounter (Signed)
Tried calling mom again still no answer. We had reach out several times to get quantity pt need per month mom still haven't return call bck. Closing encounter...Jacqueline Hebert

## 2015-08-27 NOTE — Telephone Encounter (Signed)
Patient's mother states the patient gets 3 cases of diapers, but the 3rd case only has 3 diapers. The other 2 have 18 diapers.

## 2015-08-27 NOTE — Telephone Encounter (Signed)
Faxed to [facility], attn, Kim.

## 2015-09-18 ENCOUNTER — Ambulatory Visit (INDEPENDENT_AMBULATORY_CARE_PROVIDER_SITE_OTHER): Payer: Medicare Other | Admitting: Internal Medicine

## 2015-09-18 ENCOUNTER — Encounter: Payer: Self-pay | Admitting: Internal Medicine

## 2015-09-18 ENCOUNTER — Ambulatory Visit (HOSPITAL_COMMUNITY)
Admission: RE | Admit: 2015-09-18 | Discharge: 2015-09-18 | Disposition: A | Discharge status: Home / Self Care | Payer: Medicare Other | Source: Ambulatory Visit | Attending: Internal Medicine | Admitting: Internal Medicine

## 2015-09-18 VITALS — BP 98/70 | HR 73 | Temp 96.8°F | Resp 16 | Wt 119.0 lb

## 2015-09-18 DIAGNOSIS — S8012XA Contusion of left lower leg, initial encounter: Secondary | ICD-10-CM | POA: Diagnosis present

## 2015-09-18 DIAGNOSIS — M79662 Pain in left lower leg: Secondary | ICD-10-CM | POA: Diagnosis not present

## 2015-09-18 DIAGNOSIS — S8010XA Contusion of unspecified lower leg, initial encounter: Secondary | ICD-10-CM | POA: Insufficient documentation

## 2015-09-18 NOTE — Progress Notes (Signed)
    Subjective:    Patient ID: RAYMA HEITZMAN, female    DOB: [birth year], 35 y.o.   MRN: [id number]  HPI She is here for an acute visit.  Her mother is with her.  Ruqaiyah is unable to talk, so her mother provides the history.    Left leg bruise:  The bruise was intially on the back of the lower leg -  It has been there about one week and she believes it is now gone.  Her mother does not recall of her hitting anything.  Her mother noticed a bruise now on the front of the leg that is hard.  She is unsure if it is painful.  There has not been any change in her walking.  She denies any other concerning symptoms.     Medications and allergies reviewed with patient and updated if appropriate.  Patient Active Problem List   Diagnosis Date Noted  . Mental impairment 02/05/2015  . Routine general medical examination at a health care facility 02/05/2015    Current Outpatient Prescriptions on File Prior to Visit  Medication Sig Dispense Refill  . polyethylene glycol powder (GLYCOLAX/MIRALAX) powder Take 17 g by mouth 2 (two) times daily as needed. 3350 g 11   No current facility-administered medications on file prior to visit.    Past Medical History  Diagnosis Date  . Incontinence of urine     No past surgical history on file.  Social History   Social History  . Marital Status: Single    Spouse Name: N/A  . Number of Children: N/A  . Years of Education: N/A   Social History Main Topics  . Smoking status: Never Smoker   . Smokeless tobacco: None  . Alcohol Use: No  . Drug Use: No  . Sexual Activity: Not Asked   Other Topics Concern  . None   Social History Narrative    Family History  Problem Relation Age of Onset  . Family history unknown: Yes    The patient does not speak so ROS answered by her mother. Review of Systems  Constitutional: Negative for fever, chills and diaphoresis.  Respiratory: Negative for cough, shortness of breath and wheezing.     Psychiatric/Behavioral:       No change in behavior       Objective:   Filed Vitals:   09/18/15 1049  BP: 98/70  Pulse: 73  Temp: 96.8 F (36 C)  Resp: 16   Filed Weights   09/18/15 1049  Weight: 119 lb (53.978 kg)   Body mass index is 21.09 kg/(m^2).   Physical Exam Constitutional: Appears well-developed and well-nourished. No distress.  Neck: Neck supple. No tracheal deviation present. No thyromegaly present.  No cervical adenopathy.   Cardiovascular: Normal rate, regular rhythm and normal heart sounds.   No murmur heard.  No edema Pulmonary/Chest: Effort normal and breath sounds normal. No respiratory distress. No wheezes.  Ext: no residual bruising left posterior leg, tangerine sized bruise anterior left leg on shin that appears to be tender to palpation - she withdrawals her leg.  No ankle swelling Skin: no other obvious bruising visible      Assessment & Plan:   See Problem List for Assessment and Plan of chronic medical problems.

## 2015-09-18 NOTE — Patient Instructions (Addendum)
An ultrasound of your left leg has been ordered to rule out a blood clot.  We will call you with the results.    Medications reviewed and updated.  No changes recommended at this time.   Please followup as needed

## 2015-09-18 NOTE — Assessment & Plan Note (Signed)
Superficial, slightly tender to palpation - likely related to mild trauma, but given difficulty getting information from her I will order an [place] to rule out a dvt - will be done today No generalized bruising so I will not order any blood work Her mother will monitor the area Follow-up as needed

## 2015-09-18 NOTE — Progress Notes (Signed)
*  Preliminary Results* Left lower extremity venous duplex completed. Left lower extremity is negative for deep vein thrombosis. There is no evidence of left Baker's cyst.  09/18/2015 1:41 PM  Maudry Mayhew, RVT, RDCS, RDMS

## 2015-09-18 NOTE — Progress Notes (Signed)
Pre visit review using our clinic review tool, if applicable. No additional management support is needed unless otherwise documented below in the visit note. 

## 2015-09-21 ENCOUNTER — Telehealth: Payer: Self-pay | Admitting: Emergency Medicine

## 2015-09-21 NOTE — Telephone Encounter (Signed)
error 

## 2015-12-10 ENCOUNTER — Other Ambulatory Visit: Payer: Self-pay | Admitting: *Deleted

## 2015-12-10 MED ORDER — POLYETHYLENE GLYCOL 3350 17 GM/SCOOP PO POWD: 17.0000 g | Freq: Two times a day (BID) | ORAL | Status: DC | PRN

## 2016-02-05 ENCOUNTER — Ambulatory Visit (INDEPENDENT_AMBULATORY_CARE_PROVIDER_SITE_OTHER): Payer: Medicare Other

## 2016-02-05 DIAGNOSIS — Z23 Encounter for immunization: Secondary | ICD-10-CM | POA: Diagnosis not present

## 2016-07-11 ENCOUNTER — Encounter: Payer: Medicare Other | Admitting: Internal Medicine

## 2016-08-05 ENCOUNTER — Encounter: Payer: Medicare Other | Admitting: Internal Medicine

## 2016-08-09 ENCOUNTER — Encounter: Payer: Self-pay | Admitting: Internal Medicine

## 2016-08-09 ENCOUNTER — Ambulatory Visit (INDEPENDENT_AMBULATORY_CARE_PROVIDER_SITE_OTHER): Payer: Medicare Other | Admitting: Internal Medicine

## 2016-08-09 DIAGNOSIS — Z0000 Encounter for general adult medical examination without abnormal findings: Secondary | ICD-10-CM | POA: Diagnosis not present

## 2016-08-09 NOTE — Progress Notes (Signed)
Pre visit review using our clinic review tool, if applicable. No additional management support is needed unless otherwise documented below in the visit note. 

## 2016-08-09 NOTE — Patient Instructions (Signed)
Health Maintenance, Female Adopting a healthy lifestyle and getting preventive care can go a long way to promote health and wellness. Talk with your health care provider about what schedule of regular examinations is right for you. This is a good chance for you to check in with your provider about disease prevention and staying healthy. In between checkups, there are plenty of things you can do on your own. Experts have done a lot of research about which lifestyle changes and preventive measures are most likely to keep you healthy. Ask your health care provider for more information. Weight and diet Eat a healthy diet  Be sure to include plenty of vegetables, fruits, low-fat dairy products, and lean protein.  Do not eat a lot of foods high in solid fats, added sugars, or salt.  Get regular exercise. This is one of the most important things you can do for your health.  Most adults should exercise for at least 150 minutes each week. The exercise should increase your heart rate and make you sweat (moderate-intensity exercise).  Most adults should also do strengthening exercises at least twice a week. This is in addition to the moderate-intensity exercise. Maintain a healthy weight  Body mass index (BMI) is a measurement that can be used to identify possible weight problems. It estimates body fat based on height and weight. Your health care provider can help determine your BMI and help you achieve or maintain a healthy weight.  For females 76 years of age and older:  A BMI below 18.5 is considered underweight.  A BMI of 18.5 to 24.9 is normal.  A BMI of 25 to 29.9 is considered overweight.  A BMI of 30 and above is considered obese. Watch levels of cholesterol and blood lipids  You should start having your blood tested for lipids and cholesterol at 38 years of age, then have this test every 5 years.  You may need to have your cholesterol levels checked more often if:  Your lipid or  cholesterol levels are high.  You are older than 38 years of age.  You are at high risk for heart disease. Cancer screening Lung Cancer  Lung cancer screening is recommended for adults 64-42 years old who are at high risk for lung cancer because of a history of smoking.  A yearly low-dose CT scan of the lungs is recommended for people who:  Currently smoke.  Have quit within the past 15 years.  Have at least a 30-pack-year history of smoking. A pack year is smoking an average of one pack of cigarettes a day for 1 year.  Yearly screening should continue until it has been 15 years since you quit.  Yearly screening should stop if you develop a health problem that would prevent you from having lung cancer treatment. Breast Cancer  Practice breast self-awareness. This means understanding how your breasts normally appear and feel.  It also means doing regular breast self-exams. Let your health care provider know about any changes, no matter how small.  If you are in your 20s or 30s, you should have a clinical breast exam (CBE) by a health care provider every 1-3 years as part of a regular health exam.  If you are 34 or older, have a CBE every year. Also consider having a breast X-ray (mammogram) every year.  If you have a family history of breast cancer, talk to your health care provider about genetic screening.  If you are at high risk for breast cancer, talk  to your health care provider about having an MRI and a mammogram every year.  Breast cancer gene (BRCA) assessment is recommended for women who have family members with BRCA-related cancers. BRCA-related cancers include:  Breast.  Ovarian.  Tubal.  Peritoneal cancers.  Results of the assessment will determine the need for genetic counseling and BRCA1 and BRCA2 testing. Cervical Cancer  Your health care provider may recommend that you be screened regularly for cancer of the pelvic organs (ovaries, uterus, and vagina).  This screening involves a pelvic examination, including checking for microscopic changes to the surface of your cervix (Pap test). You may be encouraged to have this screening done every 3 years, beginning at age 24.  For women ages 66-65, health care providers may recommend pelvic exams and Pap testing every 3 years, or they may recommend the Pap and pelvic exam, combined with testing for human papilloma virus (HPV), every 5 years. Some types of HPV increase your risk of cervical cancer. Testing for HPV may also be done on women of any age with unclear Pap test results.  Other health care providers may not recommend any screening for nonpregnant women who are considered low risk for pelvic cancer and who do not have symptoms. Ask your health care provider if a screening pelvic exam is right for you.  If you have had past treatment for cervical cancer or a condition that could lead to cancer, you need Pap tests and screening for cancer for at least 20 years after your treatment. If Pap tests have been discontinued, your risk factors (such as having a new sexual partner) need to be reassessed to determine if screening should resume. Some women have medical problems that increase the chance of getting cervical cancer. In these cases, your health care provider may recommend more frequent screening and Pap tests. Colorectal Cancer  This type of cancer can be detected and often prevented.  Routine colorectal cancer screening usually begins at 38 years of age and continues through 38 years of age.  Your health care provider may recommend screening at an earlier age if you have risk factors for colon cancer.  Your health care provider may also recommend using home test kits to check for hidden blood in the stool.  A small camera at the end of a tube can be used to examine your colon directly (sigmoidoscopy or colonoscopy). This is done to check for the earliest forms of colorectal cancer.  Routine  screening usually begins at age 41.  Direct examination of the colon should be repeated every 5-10 years through 38 years of age. However, you may need to be screened more often if early forms of precancerous polyps or small growths are found. Skin Cancer  Check your skin from head to toe regularly.  Tell your health care provider about any new moles or changes in moles, especially if there is a change in a mole's shape or color.  Also tell your health care provider if you have a mole that is larger than the size of a pencil eraser.  Always use sunscreen. Apply sunscreen liberally and repeatedly throughout the day.  Protect yourself by wearing long sleeves, pants, a wide-brimmed hat, and sunglasses whenever you are outside. Heart disease, diabetes, and high blood pressure  High blood pressure causes heart disease and increases the risk of stroke. High blood pressure is more likely to develop in:  People who have blood pressure in the high end of the normal range (130-139/85-89 mm Hg).  People who are overweight or obese.  People who are African American.  If you are 59-24 years of age, have your blood pressure checked every 3-5 years. If you are 34 years of age or older, have your blood pressure checked every year. You should have your blood pressure measured twice-once when you are at a hospital or clinic, and once when you are not at a hospital or clinic. Record the average of the two measurements. To check your blood pressure when you are not at a hospital or clinic, you can use:  An automated blood pressure machine at a pharmacy.  A home blood pressure monitor.  If you are between 29 years and 60 years old, ask your health care provider if you should take aspirin to prevent strokes.  Have regular diabetes screenings. This involves taking a blood sample to check your fasting blood sugar level.  If you are at a normal weight and have a low risk for diabetes, have this test once  every three years after 38 years of age.  If you are overweight and have a high risk for diabetes, consider being tested at a younger age or more often. Preventing infection Hepatitis B  If you have a higher risk for hepatitis B, you should be screened for this virus. You are considered at high risk for hepatitis B if:  You were born in a country where hepatitis B is common. Ask your health care provider which countries are considered high risk.  Your parents were born in a high-risk country, and you have not been immunized against hepatitis B (hepatitis B vaccine).  You have HIV or AIDS.  You use needles to inject street drugs.  You live with someone who has hepatitis B.  You have had sex with someone who has hepatitis B.  You get hemodialysis treatment.  You take certain medicines for conditions, including cancer, organ transplantation, and autoimmune conditions. Hepatitis C  Blood testing is recommended for:  Everyone born from [birth year] through 1967.  Anyone with known risk factors for hepatitis C. Sexually transmitted infections (STIs)  You should be screened for sexually transmitted infections (STIs) including gonorrhea and chlamydia if:  You are sexually active and are younger than 38 years of age.  You are older than 38 years of age and your health care provider tells you that you are at risk for this type of infection.  Your sexual activity has changed since you were last screened and you are at an increased risk for chlamydia or gonorrhea. Ask your health care provider if you are at risk.  If you do not have HIV, but are at risk, it may be recommended that you take a prescription medicine daily to prevent HIV infection. This is called pre-exposure prophylaxis (PrEP). You are considered at risk if:  You are sexually active and do not regularly use condoms or know the HIV status of your partner(s).  You take drugs by injection.  You are sexually active with a partner  who has HIV. Talk with your health care provider about whether you are at high risk of being infected with HIV. If you choose to begin PrEP, you should first be tested for HIV. You should then be tested every 3 months for as long as you are taking PrEP. Pregnancy  If you are premenopausal and you may become pregnant, ask your health care provider about preconception counseling.  If you may become pregnant, take 400 to 800 micrograms (mcg) of folic acid  every day.  If you want to prevent pregnancy, talk to your health care provider about birth control (contraception). Osteoporosis and menopause  Osteoporosis is a disease in which the bones lose minerals and strength with aging. This can result in serious bone fractures. Your risk for osteoporosis can be identified using a bone density scan.  If you are 4 years of age or older, or if you are at risk for osteoporosis and fractures, ask your health care provider if you should be screened.  Ask your health care provider whether you should take a calcium or vitamin D supplement to lower your risk for osteoporosis.  Menopause may have certain physical symptoms and risks.  Hormone replacement therapy may reduce some of these symptoms and risks. Talk to your health care provider about whether hormone replacement therapy is right for you. Follow these instructions at home:  Schedule regular health, dental, and eye exams.  Stay current with your immunizations.  Do not use any tobacco products including cigarettes, chewing tobacco, or electronic cigarettes.  If you are pregnant, do not drink alcohol.  If you are breastfeeding, limit how much and how often you drink alcohol.  Limit alcohol intake to no more than 1 drink per day for nonpregnant women. One drink equals 12 ounces of beer, 5 ounces of wine, or 1 ounces of hard liquor.  Do not use street drugs.  Do not share needles.  Ask your health care provider for help if you need support  or information about quitting drugs.  Tell your health care provider if you often feel depressed.  Tell your health care provider if you have ever been abused or do not feel safe at home. This information is not intended to replace advice given to you by your health care provider. Make sure you discuss any questions you have with your health care provider. Document Released: 11/29/2010 Document Revised: 10/22/2015 Document Reviewed: 02/17/2015 Elsevier Interactive Patient Education  2017 [employer].

## 2016-08-09 NOTE — Progress Notes (Signed)
   Subjective:    Patient ID: Jacqueline Hebert, female    DOB: [birth year], 36 y.o.   MRN: [id number]  HPI Here for medicare wellness, no new complaints. Please see A/P for status and treatment of chronic medical problems.   Diet: average Physical activity: sedentary Depression/mood screen: unable to verbalize  Hearing: intact to whispered voice Visual acuity: grossly normal ADLs: capable with assistance, does bathing and toileting independently, needs assist with food prep Fall risk: low Home safety: good Cognitive evaluation: unable to verbalize but sometimes able to make her wishes known EOL planning: adv directives discussed, in place grandmother has custody/guardianship  I have personally reviewed and have noted 1. The patient's medical and social history - reviewed today no changes 2. Their use of alcohol, tobacco or illicit drugs 3. Their current medications and supplements 4. The patient's functional ability including ADL's, fall risks, home safety risks and hearing or visual impairment. 5. Diet and physical activities 6. Evidence for depression or mood disorders 7. Care team reviewed and updated (available in snapshot)  Review of Systems  Constitutional: Negative.   HENT: Negative.   Eyes: Negative.   Respiratory: Negative for cough, chest tightness and shortness of breath.   Cardiovascular: Negative for chest pain, palpitations and leg swelling.  Gastrointestinal: Negative for abdominal distention, abdominal pain, constipation, diarrhea, nausea and vomiting.  Musculoskeletal: Negative.   Skin: Negative.   Neurological: Negative.   Psychiatric/Behavioral: Negative.       Objective:   Physical Exam  Constitutional: She appears well-developed and well-nourished.  HENT:  Head: Normocephalic and atraumatic.  Eyes: EOM are normal.  Neck: Normal range of motion.  Cardiovascular: Normal rate and regular rhythm.   Pulmonary/Chest: Effort normal and breath sounds normal. No  respiratory distress. She has no wheezes. She has no rales.  Abdominal: Soft. Bowel sounds are normal. She exhibits no distension. There is no tenderness. There is no rebound.  Musculoskeletal: She exhibits no edema.  Neurological: She is alert. Coordination normal.  Skin: Skin is warm and dry.   Vitals:   08/09/16 1014  BP: 124/72  Pulse: 78  Temp: 98.2 F (36.8 C)  TempSrc: Oral  SpO2: 100%  Weight: 117 lb (53.1 kg)  Height: 5\' 3"  (1.6 m)      Assessment & Plan:
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
## 2016-12-27 ENCOUNTER — Other Ambulatory Visit: Payer: Self-pay | Admitting: Internal Medicine

## 2017-05-31 ENCOUNTER — Telehealth: Payer: Self-pay | Admitting: Internal Medicine

## 2017-05-31 NOTE — Telephone Encounter (Signed)
Copied from [place]. Topic: General - Other >> May 31, 2017 10:10 AM Lolita Rieger, RMA wrote: Reason for JGO:[id number] will no longer carry miralax powder and requested change to lactulose 15 ml Pharmacy is [facility]

## 2017-06-01 MED ORDER — LACTULOSE 10 GM/15ML PO SOLN: 10.0000 g | Freq: Three times a day (TID) | ORAL | 11 refills | Status: DC

## 2017-06-01 NOTE — Telephone Encounter (Signed)
Sent in lactulose instead. Can try BID to start and increase to TID if needed for constipation. If diarrhea please decrease to daily.

## 2017-06-01 NOTE — Telephone Encounter (Signed)
Called pt mom no answer LMOM w/Md response.Marland KitchenJohny Hebert

## 2017-07-11 ENCOUNTER — Telehealth: Payer: Self-pay | Admitting: Internal Medicine

## 2017-07-11 NOTE — Telephone Encounter (Signed)
Spoke with patients mother. She states its a Medicare form that is send out every once in a while for them to check yes or no on. It states "Does the doctor say that the patient can go to work?" , She states she has always said no, the patient can not read or write. And she just wanted to check and see if that was ok. It is not anything for the doctor to sign.

## 2017-07-11 NOTE — Telephone Encounter (Signed)
We would not be able to answer any questions without seeing the form.

## 2017-07-11 NOTE — Telephone Encounter (Signed)
That's fine

## 2017-07-11 NOTE — Telephone Encounter (Signed)
Copied from [place] [id number]. Topic: Quick Communication - See Telephone Encounter >> Jul 11, 2017  1:53 PM Hewitt Shorts wrote: CRM for notification. See Telephone encounter for: pt mother louse Daoust is callilng about how to complete a medicare form that they are asking is the patient ablle to work   Best number [phone]  07/11/17.

## 2017-07-11 NOTE — Telephone Encounter (Signed)
Please advise, Is the patient able to work?

## 2017-08-02 ENCOUNTER — Telehealth: Payer: Self-pay

## 2017-08-02 NOTE — Telephone Encounter (Signed)
Placed in MD box

## 2017-08-02 NOTE — Telephone Encounter (Signed)
Copied from [place] [id number]. Topic: General - Other >> Aug 02, 2017 12:05 PM Carolyn Stare wrote:  Mom call to say that the request for pt diapers was sent to the office and they are waiting for the request to come back so that they can send out her diapers  [facility] IS THE COMPANY  [place]

## 2017-08-03 NOTE — Telephone Encounter (Signed)
LVM for [facility] to call back because I needed to ask some questions regarding patient incontinence forms.

## 2017-08-09 ENCOUNTER — Encounter: Payer: Self-pay | Admitting: Internal Medicine

## 2017-08-09 ENCOUNTER — Ambulatory Visit (INDEPENDENT_AMBULATORY_CARE_PROVIDER_SITE_OTHER): Payer: Medicare Other | Admitting: Internal Medicine

## 2017-08-09 VITALS — BP 120/80 | HR 82 | Temp 97.5°F | Ht 63.0 in | Wt 109.0 lb

## 2017-08-09 DIAGNOSIS — F79 Unspecified intellectual disabilities: Secondary | ICD-10-CM

## 2017-08-09 DIAGNOSIS — Z0000 Encounter for general adult medical examination without abnormal findings: Secondary | ICD-10-CM

## 2017-08-09 DIAGNOSIS — K5900 Constipation, unspecified: Secondary | ICD-10-CM

## 2017-08-09 DIAGNOSIS — Z23 Encounter for immunization: Secondary | ICD-10-CM | POA: Diagnosis not present

## 2017-08-09 NOTE — Progress Notes (Signed)
   Subjective:    Patient ID: Jacqueline Hebert, female    DOB: 07/20/1980, 37 y.o.   MRN: [id number]  HPI Here for medicare wellness, non-verbal but family helps to provide history. Please see A/P for status and treatment of chronic medical problems.   HPI # 2: Here for follow up of constipation (taking otc miralax now as insurance will no longer cover, going regularly now that she is on regimen of this), and her mental impairment (stable, no concerns, she is doing well and at baseline).   Diet: heart healthy Physical activity: sedentary Depression/mood screen: unknown Hearing: intact to whispered voice as she looks to sound Visual acuity: able to track people  ADLs: incapable Fall risk: low Home safety: fair Cognitive evaluation: non-verbal EOL planning: adv directives discussed  I have personally reviewed and have noted 1. The patient's medical and social history - reviewed today no changes 2. Their use of alcohol, tobacco or illicit drugs 3. Their current medications and supplements 4. The patient's functional ability including ADL's, fall risks, home safety risks and hearing or visual impairment. 5. Diet and physical activities 6. Evidence for depression or mood disorders 7. Care team reviewed and updated (available in snapshot)  Review of Systems  Unable to perform ROS: Patient nonverbal      Objective:   Physical Exam  Constitutional: She appears well-developed and well-nourished. No distress.  HENT:  Head: Normocephalic and atraumatic.  Eyes: EOM are normal.  Neck: Normal range of motion.  Cardiovascular: Normal rate and regular rhythm.  Pulmonary/Chest: Effort normal and breath sounds normal.  Abdominal: Soft. Bowel sounds are normal. She exhibits no distension. There is no tenderness. There is no rebound.  Musculoskeletal: She exhibits no edema.  Neurological:  Non-verbal, able to walk independently  Skin: Skin is warm and dry.  Psychiatric:  nonverbal   Vitals:     08/09/17 1041  BP: 120/80  Pulse: 82  Temp: (!) 97.5 F (36.4 C)  TempSrc: Axillary  SpO2: 98%  Weight: 109 lb (49.4 kg)  Height: 5\' 3"  (1.6 m)      Assessment & Plan:  Flu shot given at visit

## 2017-08-09 NOTE — Patient Instructions (Signed)

## 2017-08-10 ENCOUNTER — Telehealth: Payer: Self-pay | Admitting: Internal Medicine

## 2017-08-10 DIAGNOSIS — K5900 Constipation, unspecified: Secondary | ICD-10-CM | POA: Insufficient documentation

## 2017-08-10 NOTE — Telephone Encounter (Signed)
Called mother back and let her know that I had faxed the letter to [facility] this morning. Stated understanding

## 2017-08-10 NOTE — Telephone Encounter (Signed)
Copied from [place] [phone]. Topic: Quick Communication - See Telephone Encounter >> Aug 10, 2017 12:53 PM Bea Graff, NT wrote: CRM for notification. See Telephone encounter for: Pts mom calling to see if Dr. Sharlet Salina got in touch with [facility] in [place] to get her daughters adult diapers. Phone #: [phone] Fax#: [phone]  08/10/17.

## 2017-08-10 NOTE — Telephone Encounter (Signed)
Patient family is going through a different company
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
## 2017-08-10 NOTE — Assessment & Plan Note (Signed)
Taking miralax otc and is on a good regimen. Will continue with this.

## 2017-08-10 NOTE — Assessment & Plan Note (Signed)
Stable, cannot follow commands and is non-verbal. Needs incontinence supplies so we will renew form and send letter of medical need to supply company.

## 2017-08-10 NOTE — Telephone Encounter (Signed)
[facility] returning call, [phone]

## 2017-08-21 NOTE — Telephone Encounter (Signed)
Pt mother called back and states that [employer] didn't receive order and she didn't receive anything from them. She went and bought some $189. Please call her back [phone].

## 2017-08-21 NOTE — Telephone Encounter (Signed)
Called patients mother back, patient stated that the number she had for [facility] was not the right number. I looked up the new number and fax number and gave to patients mother and re-faxed letter to correct fax number.

## 2017-08-28 ENCOUNTER — Telehealth: Payer: Self-pay | Admitting: Internal Medicine

## 2017-08-28 NOTE — Telephone Encounter (Signed)
LVM for misty wit patient height and weight

## 2017-08-28 NOTE — Telephone Encounter (Signed)
Copied from [place]. Topic: Quick Communication - See Telephone Encounter >> Aug 28, 2017 10:01 AM Bea Graff, NT wrote: CRM for notification. See Telephone encounter for: 08/28/17. Misty with [employer] needing the pts height and weight for the form that was sent to her last week from Dr. Sharlet Salina. CB#: [phone]

## 2017-09-06 NOTE — Telephone Encounter (Signed)
Re-faxed forms with height and weight

## 2017-09-06 NOTE — Telephone Encounter (Signed)
Veronica with [employer] is needing the order form faxed to her with the pts height and weight on the sheet. Fax#: [phone]

## 2018-03-30 ENCOUNTER — Telehealth: Payer: Self-pay | Admitting: Internal Medicine

## 2018-03-30 NOTE — Telephone Encounter (Signed)
Copied from [place] [phone]. Topic: General - Inquiry >> Mar 30, 2018  2:50 PM Scherrie Gerlach wrote: Reason for CRM: mom calling to advise she received a letter from [employer] that read the doctor advised them pt is getting better.  Mom states pt is not getting better.   They want to stop pt's money ($570/mo) if pt is getting better. Mom states no change in the pt, she still cannot talk, and wears pull ups. Mom says she has misplaced the letter, but will try to find it and bring to the office.

## 2018-04-03 NOTE — Telephone Encounter (Signed)
fyi

## 2018-08-06 ENCOUNTER — Ambulatory Visit (INDEPENDENT_AMBULATORY_CARE_PROVIDER_SITE_OTHER): Payer: Medicare Other

## 2018-08-06 DIAGNOSIS — Z23 Encounter for immunization: Secondary | ICD-10-CM

## 2018-08-29 ENCOUNTER — Ambulatory Visit: Payer: Medicare Other | Admitting: Internal Medicine

## 2018-11-08 ENCOUNTER — Ambulatory Visit: Payer: Medicare Other | Admitting: Internal Medicine

## 2018-11-21 ENCOUNTER — Ambulatory Visit (INDEPENDENT_AMBULATORY_CARE_PROVIDER_SITE_OTHER): Payer: Medicare Other | Admitting: *Deleted

## 2018-11-21 ENCOUNTER — Encounter: Payer: Self-pay | Admitting: Internal Medicine

## 2018-11-21 ENCOUNTER — Ambulatory Visit (INDEPENDENT_AMBULATORY_CARE_PROVIDER_SITE_OTHER): Payer: Medicare Other | Admitting: Internal Medicine

## 2018-11-21 ENCOUNTER — Other Ambulatory Visit: Payer: Self-pay | Admitting: *Deleted

## 2018-11-21 ENCOUNTER — Other Ambulatory Visit: Payer: Self-pay

## 2018-11-21 VITALS — BP 120/78 | HR 76 | Ht 63.0 in | Wt 105.0 lb

## 2018-11-21 VITALS — BP 120/78 | HR 76 | Temp 97.3°F | Ht 63.0 in | Wt 105.0 lb

## 2018-11-21 DIAGNOSIS — K5900 Constipation, unspecified: Secondary | ICD-10-CM

## 2018-11-21 DIAGNOSIS — F79 Unspecified intellectual disabilities: Secondary | ICD-10-CM | POA: Diagnosis not present

## 2018-11-21 DIAGNOSIS — Z0000 Encounter for general adult medical examination without abnormal findings: Secondary | ICD-10-CM

## 2018-11-21 NOTE — Patient Outreach (Signed)
[facility]) Care Management  11/21/2018  Jacqueline Hebert 03/02/1981 [id number]   Telephone Screen  Referral Date: 11/21/18 Jacqueline Hebert) Referral Source: MD referral  Referral Reason: community resources; patient is disabled and mother is elderly. Mother having difficulty caring for patient Insurance: united health care medicare     Outreach attempt #1 successful at the home number  Patient's mother , Jacqueline Hebert, is able to verify HIPAA Reviewed and addressed referral to [facility] with Jacqueline Hebert. 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 She is nonverbal and unable to express her needs. She mainly moans, is non aggressive, calm and compliant per Jacqueline Hebert. She is dependent in all care and transportation needs. She is incontinent of urine and bowels. She wears pull ups. She is able to walk (has since ~ age 39 or 56).  Preference of Jacqueline Hebert to have Jacqueline Hebert remain in a home environment vs a facility.  She previously attended a school for disabled members.  Jacqueline Hebert and Jacqueline Hebert have lived in [place] since 2006.  Jacqueline Hebert reports Jacqueline Hebert, Jacqueline Hebert, has agreed to care for Jacqueline Hebert if something were to happen to Jacqueline Hebert. Jacqueline Hebert assists with transportation, transportation to medical appointments, some errands and grocery shopping for frozen meals on occasions. Jacqueline Hebert lives in [place] but on the other side of town. Jacqueline Hebert's biological parents remain alive but are not involved in her care since her adoption after the age of [age]. The biological mother, Jacqueline Hebert, has various medical issues.  Jacqueline Hebert previously had Medicaid services but coverage lapsed and Jacqueline Hebert reports difficult in completing a new medicaid application. Jacqueline Hebert reports Jacqueline Hebert receives SSI at $500/month   Jacqueline Hebert reports  Jacqueline Hebert's health as fair and she has lost weight related to Jacqueline Hebert's difficulty in preparing her meals. 11/21/18 wt of 105 lbs BMI 18.6. The MD office offered Ensure during the visit on 11/21/18  Jacqueline Hebert reports dental concerns have been addressed but is paid for as an out of pocket expense (Jacqueline Hebert).   Conditions: mental impairment, constipation, st loss, dental concerns  Falls none DME: none  Medications: only takes in OTC miralax for constipation [name] is obtained from [place] or delivered by [employer] along with Jacqueline Hebert's medication delivery  Appointments: none pending   Advance Directives: Does not have advance directives Mother has legal guardianship paper provided by the court system   Consent: [facility] RN CM reviewed [facility] services with patient. Patient gave verbal consent for services [facility] telephonic RN CM and [facility].   Plan: [facility] RN CM will refer Ms Mign[place]na to [facility] [address] for community resources per MD referral reason that states patient is disabled and mother is elderly. Mother having difficulty caring for patient. Her mother is requesting assistance with resources for  meals on wheels, financial assistance, medicaid services, possible legal resource to remove biological mother from and added Jacqueline Hebert to legal forms, DME (Diapers, Ensure or other nutritional supplemental supplies) and alternate transportation resources.  [facility] RN CM will close case at this time   Pt encouraged to return a call to [name] prn  [facility] RN CM sent a successful outreach letter as discussed with [facility] brochure enclosed for review  Routed to MD  [place]. Lavina Hamman, RN, BSN, [place]  Management Care Coordinator Office number [phone] Mobile number [phone]  Main [facility] number [phone] Fax number [phone]

## 2018-11-21 NOTE — Progress Notes (Signed)
Subjective:   Jacqueline Hebert is a 38 y.o. female who presents for Medicare Annual (Subsequent) preventive examination.  Review of Systems:     Sleep patterns: no sleep issues and sleeps 8-9 hours nightly.    Home Safety/Smoke Alarms: Feels safe in home. Smoke alarms in place.  Living environment; residence and Firearm Safety: 1-story house/ trailer. Lives with mother, no needs for DME, limited support system Seat Belt Safety/Bike Helmet: Wears seat belt.     Objective:     Vitals: BP 120/78   Pulse 76   Ht 5\' 3"  (1.6 m)   Wt 105 lb (47.6 kg)   SpO2 99%   BMI 18.60 kg/m   Body mass index is 18.6 kg/m.  Advanced Directives 11/21/2018 04/10/2015  Does Patient Have a Medical Advance Directive? Yes Yes  Type of Paramedic of [place];Living will -  Copy of [place] in Chart? Yes - validated most recent copy scanned in chart (See row information) -    Tobacco Social History   Tobacco Use  Smoking Status Never Smoker  Smokeless Tobacco Never Used     Counseling given: Not Answered  Past Medical History:  Diagnosis Date  . Incontinence of urine    No past surgical history on file. Family History  Family history unknown: Yes   Social History   Socioeconomic History  . Marital status: Single    Spouse name: Not on file  . Number of children: Not on file  . Years of education: Not on file  . Highest education level: Not on file  Occupational History  . Occupation: disability  Social Needs  . Financial resource strain: Hard  . Food insecurity    Worry: Sometimes true    Inability: Sometimes true  . Transportation needs    Medical: Yes    Non-medical: Yes  Tobacco Use  . Smoking status: Never Smoker  . Smokeless tobacco: Never Used  Substance and Sexual Activity  . Alcohol use: No    Alcohol/week: 0.0 standard drinks  . Drug use: No  . Sexual activity: Not Currently  Lifestyle  . Physical activity    Days per  week: 0 days    Minutes per session: 0 min  . Stress: Not on file  Relationships  . Social Herbalist on phone: Not on file    Gets together: Not on file    Attends religious service: Not on file    Active member of club or organization: Not on file    Attends meetings of clubs or organizations: Not on file    Relationship status: Not on file  Other Topics Concern  . Not on file  Social History Narrative   Patient is non-verbal and her adoptive mother is her full-time caregiver.    No outpatient encounter medications on file as of 11/21/2018.   No facility-administered encounter medications on file as of 11/21/2018.     Activities of Daily Living In your present state of health, do you have any difficulty performing the following activities: 11/21/2018  Hearing? N  Vision? N  Difficulty concentrating or making decisions? Y  Walking or climbing stairs? N  Dressing or bathing? Y  Doing errands, shopping? Y  Preparing Food and eating ? Y  Using the Toilet? Y  In the past six months, have you accidently leaked urine? Y  Do you have problems with loss of bowel control? Y  Managing your Medications? Jacqueline Hebert  Managing your Finances? Y  Housekeeping or managing your Housekeeping? Y  Some recent data might be hidden    Patient Care Team: [name], MD as PCP - General (Internal Medicine)    Assessment:   This is a routine wellness examination for Jacqueline Hebert. Physical assessment deferred to PCP.   Exercise Activities and Dietary recommendations Current Exercise Habits: The patient does not participate in regular exercise at present, Exercise limited by: psychological condition(s)  Diet (meal preparation, eat out, water intake, caffeinated beverages, dairy products, fruits and vegetables): in general, a "healthy" diet     Mother reports she is having difficulty preparing meals for patient.  Discussed supplementing with Ensure, samples and coupons provided. Encouraged  patient to increase daily water and healthy fluid intake. Nurse will follow-up with Meals on Wheels program.    Goals      Patient Stated   . patient (pt-stated)     Dtr will provide care when Jacqueline Hebert is no longer able;         Fall Risk Fall Risk  11/21/2018 04/10/2015  Falls in the past year? 0 No  Comment mother states no falls -  Number falls in past yr: 0 -    Depression Screen PHQ 2/9 Scores 11/21/2018 04/10/2015  PHQ - 2 Score - 0  Exception Documentation Other- indicate reason in comment box -     Cognitive Function MMSE - Mini Mental State Exam 04/10/2015  Not completed: (No Data)        Immunization History  Administered Date(s) Administered  . Influenza,inj,Quad PF,6+ Mos 02/05/2015, 02/05/2016, 08/09/2017, 08/06/2018  . Td 09/29/2005  . Tetanus 11/28/2014   Screening Tests Health Maintenance  Topic Date Due  . HIV Screening  08/10/2048 (Originally 01/25/1994)  . PAP SMEAR-Modifier  11/20/2048 (Originally 01/26/2000)  . INFLUENZA VACCINE  12/29/2018  . TETANUS/TDAP  11/27/2024      Plan:    [facility] referral for SW assistance  I have personally reviewed and noted the following in the patient's chart:   . Medical and social history . Use of alcohol, tobacco or illicit drugs  . Current medications and supplements . Functional ability and status . Nutritional status . Physical activity . Advanced directives . List of other physicians . Vitals . Screenings to include cognitive, depression, and falls . Referrals and appointments  In addition, I have reviewed and discussed with patient certain preventive protocols, quality metrics, and best practice recommendations. A written personalized care plan for preventive services as well as general preventive health recommendations were provided to patient.     Michiel Cowboy, RN  11/21/2018

## 2018-11-21 NOTE — Progress Notes (Signed)
   Subjective:   Patient ID: Jacqueline Hebert, female    DOB: 26-Feb-1981, 38 y.o.   MRN: [id number]  HPI The patient is a 38 YO female coming in for physical. Patient is non-verbal and her grandmother has guardianship and accompanies her. She does not seem to have any complaints.   PMH, [facility], social history reviewed and updated  Review of Systems  Unable to perform ROS: Patient nonverbal  Constitutional: Negative for activity change and appetite change.  Gastrointestinal: Negative for constipation.  Musculoskeletal: Negative for arthralgias and gait problem.    Objective:  Physical Exam Constitutional:      Appearance: She is well-developed.  HENT:     Head: Normocephalic and atraumatic.  Neck:     Musculoskeletal: Normal range of motion.  Cardiovascular:     Rate and Rhythm: Normal rate and regular rhythm.  Pulmonary:     Effort: Pulmonary effort is normal. No respiratory distress.     Breath sounds: Normal breath sounds. No wheezing or rales.  Abdominal:     General: Bowel sounds are normal. There is no distension.     Palpations: Abdomen is soft.     Tenderness: There is no abdominal tenderness. There is no rebound.  Skin:    General: Skin is warm and dry.  Neurological:     Mental Status: She is alert. Mental status is at baseline.     Coordination: Coordination normal.     Comments: Non-verbal     Vitals:   11/21/18 0916  BP: 120/78  Pulse: 76  Temp: (!) 97.3 F (36.3 C)  TempSrc: Oral  SpO2: 99%  Weight: 105 lb (47.6 kg)  Height: 5\' 3"  (1.6 m)    Assessment & Plan:

## 2018-11-21 NOTE — Assessment & Plan Note (Signed)
Stable mental status and no apparent new problems. She is unable to express needs to care for self. Lives with guardian her grandmother and limited support.

## 2018-11-21 NOTE — Assessment & Plan Note (Signed)
Taking miralax otc and doing well with this.

## 2018-11-21 NOTE — Assessment & Plan Note (Signed)
Flu shot counseled. Tetanus up to date due 2026. Pap smear declined at this time due to non-verbal and experience in past with gyn was traumatic. Counseled about sun safety and mole surveillance. Counseled about the dangers of distracted driving. Given 10 year screening recommendations.

## 2018-11-22 NOTE — Progress Notes (Signed)
Medical screening examination/treatment/procedure(s) were performed by non-physician practitioner and as supervising physician I was immediately available for consultation/collaboration. I agree with above. Elizabeth A Crawford, MD 

## 2019-06-20 ENCOUNTER — Other Ambulatory Visit: Payer: Self-pay

## 2019-06-20 ENCOUNTER — Encounter: Payer: Self-pay | Admitting: Internal Medicine

## 2019-06-20 ENCOUNTER — Ambulatory Visit (INDEPENDENT_AMBULATORY_CARE_PROVIDER_SITE_OTHER): Payer: Medicare Other | Admitting: Internal Medicine

## 2019-06-20 DIAGNOSIS — Z23 Encounter for immunization: Secondary | ICD-10-CM

## 2019-06-20 DIAGNOSIS — K5900 Constipation, unspecified: Secondary | ICD-10-CM

## 2019-06-20 DIAGNOSIS — F79 Unspecified intellectual disabilities: Secondary | ICD-10-CM | POA: Diagnosis not present

## 2019-06-20 NOTE — Progress Notes (Signed)
   Subjective:   Patient ID: Jacqueline Hebert, female    DOB: 1980/07/31, 39 y.o.   MRN: [id number]  HPI The patient is a 39 YO female coming in for follow up medical health. She does not speak and her grandmother has custody and cares for her. Grandmother denies new concerns about her health. Constipation is chronic and overall doing fine. Denies fevers or chills. No new cough or SOB.   Review of Systems  Unable to perform ROS: Patient nonverbal    Objective:  Physical Exam Constitutional:      Appearance: She is well-developed.  HENT:     Head: Normocephalic and atraumatic.  Cardiovascular:     Rate and Rhythm: Normal rate and regular rhythm.  Pulmonary:     Effort: Pulmonary effort is normal. No respiratory distress.     Breath sounds: Normal breath sounds. No wheezing or rales.  Abdominal:     General: Bowel sounds are normal. There is no distension.     Palpations: Abdomen is soft.     Tenderness: There is no abdominal tenderness. There is no rebound.  Musculoskeletal:     Cervical back: Normal range of motion.  Skin:    General: Skin is warm and dry.  Neurological:     Mental Status: She is alert and oriented to person, place, and time.     Coordination: Coordination normal.     Vitals:   06/20/19 1542  BP: 112/80  Pulse: 79  SpO2: 98%  Weight: 101 lb (45.8 kg)  Height: 5\' 3"  (1.6 m)    This visit occurred during the SARS-CoV-2 public health emergency.  Safety protocols were in place, including screening questions prior to the visit, additional usage of staff PPE, and extensive cleaning of exam room while observing appropriate contact time as indicated for disinfecting solutions.   Assessment & Plan:  Flu shot given at visit

## 2019-06-22 NOTE — Assessment & Plan Note (Signed)
Stable with otc as needed.

## 2019-06-22 NOTE — Assessment & Plan Note (Signed)
Overall stable, there are some guardianship concerns as her grandmother is getting older and they are trying to arrange for a plan in case something happens. There is another person willing in her 30s but her health is poor.

## 2019-08-22 ENCOUNTER — Ambulatory Visit: Payer: Medicare Other | Attending: Internal Medicine

## 2019-08-22 DIAGNOSIS — Z23 Encounter for immunization: Secondary | ICD-10-CM

## 2019-08-22 NOTE — Progress Notes (Signed)
   Covid-19 Vaccination Clinic  Name:  Jacqueline Hebert    MRN: [id number] DOB: 10/04/1980  08/22/2019  Jacqueline Hebert was observed post Covid-19 immunization for 15 minutes without incident. She was provided with Vaccine Information Sheet and instruction to access the V-Safe system.   Jacqueline Hebert was instructed to call 911 with any severe reactions post vaccine: Marland Kitchen Difficulty breathing  . Swelling of face and throat  . A fast heartbeat  . A bad rash all over body  . Dizziness and weakness   Immunizations Administered    Name Date Dose VIS Date Route   Pfizer COVID-19 Vaccine 08/22/2019 10:40 AM 0.3 mL 05/10/2019 Intramuscular   Manufacturer: [place]   Lot: [id number]   [place]: [id number]

## 2019-09-16 ENCOUNTER — Ambulatory Visit: Payer: Medicare Other | Attending: Internal Medicine

## 2019-09-16 ENCOUNTER — Ambulatory Visit: Payer: Medicare Other | Admitting: Internal Medicine

## 2019-09-16 DIAGNOSIS — Z23 Encounter for immunization: Secondary | ICD-10-CM

## 2019-09-16 NOTE — Progress Notes (Signed)
   Covid-19 Vaccination Clinic  Name:  MIKAYA GILTNER    MRN: [id number] DOB: [birth year]  09/16/2019  Ms. Gust was observed post Covid-19 immunization for 15 minutes without incident. She was provided with Vaccine Information Sheet and instruction to access the V-Safe system.   Ms. Ingham was instructed to call 911 with any severe reactions post vaccine: Marland Kitchen Difficulty breathing  . Swelling of face and throat  . A fast heartbeat  . A bad rash all over body  . Dizziness and weakness   Immunizations Administered    Name Date Dose VIS Date Route   Pfizer COVID-19 Vaccine 09/16/2019 10:03 AM 0.3 mL 07/24/2018 Intramuscular   Manufacturer: [place]   Lot: [id number]   [place]: [id number]

## 2019-09-24 ENCOUNTER — Other Ambulatory Visit: Payer: Self-pay

## 2019-09-24 ENCOUNTER — Encounter: Payer: Self-pay | Admitting: Internal Medicine

## 2019-09-24 ENCOUNTER — Ambulatory Visit (INDEPENDENT_AMBULATORY_CARE_PROVIDER_SITE_OTHER): Payer: Medicare Other | Admitting: Internal Medicine

## 2019-09-24 DIAGNOSIS — M79642 Pain in left hand: Secondary | ICD-10-CM | POA: Diagnosis not present

## 2019-09-24 MED ORDER — MELOXICAM 15 MG PO TABS
15.0000 mg | ORAL_TABLET | Freq: Every day | ORAL | 3 refills | Status: DC
Start: 2019-09-24 — End: 2020-03-30

## 2019-09-24 NOTE — Patient Instructions (Signed)
We have sent in meloxicam to try taking daily for 2 weeks to see if those bumps on her hands will go down in size and not hurt.   We will also get the forms filled out for the aide in the home.

## 2019-09-24 NOTE — Assessment & Plan Note (Signed)
Rx meloxicam, if no improvement referral to sports medicine/hand surgery.

## 2019-09-24 NOTE — Progress Notes (Signed)
   Subjective:   Patient ID: Jacqueline Hebert, female    DOB: 11-Jul-1980, 39 y.o.   MRN: [id number]  HPI The patient is a 39 YO female coming in with her guardian (grandmother) who provides history. About 1 month ago she noticed bumps on her left hand. These seem to hurt her (she is non-verbal) and she rubs them a lot. Denies that they have grown in that time. Overall stable. No fevers or chills. They are both fully vaccinated against covid-19.  Review of Systems  Unable to perform ROS: Patient nonverbal  Musculoskeletal: Positive for myalgias.    Objective:  Physical Exam Constitutional:      Appearance: She is well-developed.  HENT:     Head: Normocephalic and atraumatic.  Cardiovascular:     Rate and Rhythm: Normal rate and regular rhythm.  Pulmonary:     Effort: Pulmonary effort is normal. No respiratory distress.     Breath sounds: Normal breath sounds. No wheezing or rales.  Abdominal:     General: Bowel sounds are normal. There is no distension.     Palpations: Abdomen is soft.     Tenderness: There is no abdominal tenderness. There is no rebound.  Musculoskeletal:        General: Tenderness present.     Cervical back: Normal range of motion.     Comments: Some red bumps firm on the left PIP 3-4th fingers   Skin:    General: Skin is warm and dry.  Neurological:     Mental Status: She is alert and oriented to person, place, and time.     Coordination: Coordination normal.     Vitals:   09/24/19 1110  BP: 116/70  Pulse: 72  SpO2: 99%  Weight: 102 lb (46.3 kg)  Height: 5\' 3"  (1.6 m)    This visit occurred during the SARS-CoV-2 public health emergency.  Safety protocols were in place, including screening questions prior to the visit, additional usage of staff PPE, and extensive cleaning of exam room while observing appropriate contact time as indicated for disinfecting solutions.   Assessment & Plan:

## 2019-11-21 ENCOUNTER — Telehealth: Payer: Self-pay

## 2019-11-21 NOTE — Telephone Encounter (Signed)
New message   Jacqueline Hebert calling wanted to let Dr. Sharlet Salina know incontinence supplies forms will be coming over from [place] delivery through [facility]  Office fax # was given  [phone]  Dr. Sharlet Salina fax # [phone]

## 2019-12-09 ENCOUNTER — Telehealth: Payer: Self-pay

## 2019-12-09 NOTE — Telephone Encounter (Signed)
New message   Home deliveries calling   1. # 21 & # 30 needs more clarification    2. Full date of the onset of diagnosis  3. What's the primary diagnosis code

## 2019-12-10 NOTE — Telephone Encounter (Signed)
Forms have been printed out from the media section and placed on PCP desk.

## 2019-12-11 NOTE — Telephone Encounter (Signed)
Forms have been faxed again with the necessary corrections.

## 2019-12-11 NOTE — Telephone Encounter (Signed)
Placed back on your desk to re-fax.

## 2020-03-16 ENCOUNTER — Other Ambulatory Visit: Payer: Self-pay

## 2020-03-16 ENCOUNTER — Ambulatory Visit (INDEPENDENT_AMBULATORY_CARE_PROVIDER_SITE_OTHER): Payer: Medicare Other | Admitting: *Deleted

## 2020-03-16 DIAGNOSIS — Z23 Encounter for immunization: Secondary | ICD-10-CM | POA: Diagnosis not present

## 2020-03-30 ENCOUNTER — Other Ambulatory Visit: Payer: Self-pay | Admitting: Internal Medicine

## 2020-04-27 ENCOUNTER — Other Ambulatory Visit: Payer: Self-pay | Admitting: Internal Medicine

## 2020-04-28 ENCOUNTER — Other Ambulatory Visit: Payer: Self-pay | Admitting: *Deleted

## 2020-04-28 NOTE — Patient Outreach (Addendum)
[facility]) Care Management  04/28/2020  Devery Odwyer Snelson [birth year] [id number]   Referral Date: 11/30 Referral Source: MD office Referral Reason: Need SNF placement Insurance: [place] attempt #1, successful to sister [name].  Identity verified.  This care manager introduced self and stated purpose of call.  [facility] care management services explained.    Social: Member lives with her mother, however she is no longer able to care for her due to recent stroke.  Sister has been helping to care for both of them, now looking to have them placed.  Sister report she does have Medicaid, was deemed eligible for 4 hours a day of in home aide care.    Conditions: Per chart, history of mental retardation, non-verbal.  Medications: Currently not taking any medications.  Appointments: Last nurse visit at PCP office was 10/18, sister provides transportation.   No nursing/medical concerns voiced during screening, encouraged to call this care manager with questions.  Plan: RN CM will place referral to CSW, will follow up with sister within the next 2 weeks.  Valente David, [place], MSN [place] [facility] [phone]

## 2020-04-30 ENCOUNTER — Encounter: Payer: Self-pay | Admitting: *Deleted

## 2020-04-30 ENCOUNTER — Other Ambulatory Visit: Payer: Self-pay | Admitting: *Deleted

## 2020-04-30 NOTE — Patient Outreach (Signed)
[facility]) Care Management  04/30/2020  Dorethy Tomey Seelbach 1980-12-24 [id number]   CSW was able to make initial contact with patient's sister, [name] today, to perform the phone assessment on patient, as well as assess and assist with social work needs and services.  CSW introduced self, explained role and types of services provided through [facility] ([place] [facility]).  CSW further explained to Ms. [name] that [place] works with patient's RNCM, also with [facility], Valente David.  CSW then explained the reason for the call, indicating that Mrs. Orene Desanctis thought that patient would benefit from social work services and resources to assist with arranging long-term care placement into a skilled nursing facility.  CSW obtained two HIPAA compliant identifiers from Ms. [name], which included patient's name and date of birth.  Ms. [name] started off the conversation by reporting that she is patient's Legal Guardian, as well as Press photographer for her mother, Delayna Sparlin, with whom patient currently resides.    According to Ms. [name], she has been providing 24 care and supervision, to both patient and Ms. Mateus, since Ms. Duba suffered from a Stroke earlier in the year.  Upon discharge from the hospital, Ms. Caldron was placed into a skilled nursing facility to receive short-term rehabilitative services.  Since returning home, Ms. Brindle has fallen on several different occasions, unable to regain her strength and mobility, requesting that she and patient be placed into a long-term care skilled nursing facility together.  CSW is also assisting Ms. Donalson with placement arrangements.  Ms. [name] indicated that Ms. Abruzzese is now completely bed/wheelchair bound, unable to perform activities of daily living independently.  Ms. [name] reported that patient has always required 24 hour care and supervision, which Ms. Ekstrand was able to provide, prior to her Stroke.   CSW explained the entire placement process to Ms. [name], first inquiring about patient and Ms. Luse's payor source, and how they plan to pay for long-term care skilled nursing placement.    Ms. [name] admitted that patient, nor Ms. Beshears, have the financial means to pay for skilled nursing out-of-pocket, as patient only receives a Adult nurse, and Ms. Doolan a Fish farm manager Income check, once a month.  Ms. [name] went on to explain that patient does not have any assets, already receiving Adult Medicaid, through the [place].  Ms. [name] is aware that patient's Adult Medicaid coverage will need to be changed to [place] Medicaid, as [place] Medicaid will pay the additional cost of what patient's Social Security Disability does not cover, at the skilled nursing facility.  In the meantime, CSW encouraged Ms. [name] to complete an application for [place] Medicaid for Ms. Ethridge, as well as contact patient's medicaid case worker, to have her Adult Medicaid coverage changed to [place] Medicaid.    CSW agreed to mail ([address] [place], [place] [address]), and e-mail ([email or web address]), Ms. [name] 2 Special Assistance Long-Term Care Medicaid applications for her review.  In addition, CSW will mail and e-mail Ms. [name] the following list of resources: 2021 Medicaid Tips, 2 [place] Medicaid Long Term Care [place]-2 Forms, [facility] Directory of Nursing Homes, and Complete Listing of All [place] in [place], [place], [place], [place] and [place].  Ms. [name] was encouraged to sit down with patient, Ms. Turano, and her other siblings, to decide on several  skilled nursing facilities of interest, so that [place] can fax patient and Ms. Cardamone's information and FL-2 Forms to all of their facilities of interest, to try and pursue  bed offers. CSW is aware that Ms. [name], Ms. Cherry and patient all wish for patient and Ms. Cuneo to be placed into the same long-term care skilled nursing facility, agreeing to do her best to try and make this happen.  CSW agreed to contact patient and Ms. Aycock's Primary Care Physician, Dr. Pricilla Holm, to request that she complete and sign FL-2 Forms for them both.  However, CSW explained to Ms. [name] that [place] will hold off on making this request to Dr. Sharlet Salina, until it has been determined whether or not they will be eligible for [place] Medicaid, as the FL-2 Form expires after 30 days.  Ms. [name] indicated that she will try to schedule a family meeting within the next few weeks, as well as complete the applications for [place] Medicaid, and submit both applications to the [place] for processing.  Ms. [name] voiced understanding with regards to CSW explaining that this can often be a lengthy process, as it typically takes anywhere from 45-60 days, just for the Department of Social Services to process the applications.  Ms. [name] indicated that she is just so happy to receive assistance, willing to wait however long it takes to ensure that patient and Ms. Strohecker are placed into the proper facility.    Ms. [name] reported that she is currently having to prepare all of patient and Ms. Hands's meals, ensuring that patient's meals are all finely chopped or pureed, as patient has a history of Aspiration Pneumonia.  In addition, Ms. [name] stated that she is responsible for transporting patient and Ms. Legrand to and from all of their physician appointments, administering their medications, performing all of the grocery shopping, cleaning and folding all of the laundry, performing all of the housekeeping duties, and assisting both of them with bathing, feeding and dressing.  CSW commended Ms. [name] for all that she is doing,  and for taking the initiative to be the sole caregiver for both her mother and her sister.  Ms. [name] was agreeable to having CSW follow-up with her again next week, on Thursday, May 07, 2020, around 1:00PM.  CSW was able to confirm that Ms. [name] has the correct contact information for CSW, encouraging her to contact CSW directly if she has additional questions or if additional social work needs arise in the meantime.  [name], BSW, MSW, LCSW  Licensed Education officer, environmental Health System  Mailing [place] [address]. [address], [place], [place] [address] Physical Address[address], [place], [place] [address] Toll Free Main # [phone] Fax # [phone] Cell # [phone]  [email or web address]

## 2020-05-01 ENCOUNTER — Other Ambulatory Visit: Payer: Self-pay | Admitting: Internal Medicine

## 2020-05-07 ENCOUNTER — Other Ambulatory Visit: Payer: Self-pay | Admitting: *Deleted

## 2020-05-07 NOTE — Patient Outreach (Signed)
[facility] Care Management  05/07/2020  Jacqueline Hebert 1980-10-10 [id number]   CSW was able to make contact with patient's sister, [name] today, to follow-up regarding social work services and resources for patient, as well as to ensure that she received the resources and applications that CSW mailed and e-mailed to her, per her request.  Mrs. [name] confirmed receipt, stating, "I realize I have a lot of work to do and a lot of mom's affairs that need to be handled before I can even begin to consider placing her and my 41 year old disabled sister".  Mrs. [name] indicated that she had a long talk with patient and all of her siblings, and that it has been decided that they will go ahead and put patient's house on the market, For Sale By Owner, realizing that they will need that source of income to help pay for patient at the long-term care assisted living facility.  CSW reminded Mrs. [name] that she will not need to complete a new Medicaid application for patient, but simply contact patient's Medicaid case worker, with the [place] to request that her coverage be changed to [place] Medicaid  Mrs. [name] indicated that patient does not have any assets, and no other source of income, besides her Social Security Disability check, so she should have no problem with getting her approved for [place] Medicaid.  However, Mrs. [name] reported that she will hold off on changing patient's coverage for the time-being, admitting that patient will not leave her mother's side, refusing to go anywhere until her mother is able to go with her.  CSW encouraged Mrs. [name] to be considering long-term care assisted living facilities that offer a two-person suite, so that the two of them do not have to be separated.  CSW encouraged Mrs. [name] to contact CSW directly if she has additional questions, or if she needs further  assistance between follow-up calls, ensuring that she has the correct contact information for CSW.  Mrs. [name] admitted that it will definitely take her some time to get all of her mother's affairs in order, requesting that CSW follow-up with her again around the end of the month.  Mrs. [name] was agreeable to having CSW contact her on Monday, May 25, 2020, around 10:00AM.  [name], BSW, MSW, [place]  Licensed Clinical Social Worker  [place]  Mailing [place] [address]. [address] [place], [place] [address] Physical Address[address], [place], [place] [address] Toll Free Main # [phone] Fax # [phone] Cell # [phone]  [email or web address]

## 2020-05-12 ENCOUNTER — Other Ambulatory Visit: Payer: Self-pay | Admitting: *Deleted

## 2020-05-12 NOTE — Patient Outreach (Signed)
[facility]) Care Management  05/12/2020  [place] Aug 30, 1980 [id number]   Outgoing call placed to sister today to follow up on contact with CSW regarding plan for placement.  Report she is working on the paperwork for member, will take a while to complete process.  Member is receiving aide care through [facility] program, but per sister there has been some communication issues.  They are not being notified if aide is not coming, should be receiving visits daily.  This has been discussed with agency, if continue she will call Medicaid/[place] to change agencies.  Denies any nursing needs at this time, will close to [facility] as sister is a nurse and able to manage chronic health conditions.  Sister aware that case will remain open to CSW, will contact this care manager nursing question should arise.  Valente David, [place], MSN [place] [phone]

## 2020-05-25 ENCOUNTER — Other Ambulatory Visit: Payer: Self-pay | Admitting: *Deleted

## 2020-05-25 NOTE — Patient Outreach (Signed)
[facility]) Care Management  05/25/2020  Pocahontas Cohenour [place] 09/23/1980 [id number]   CSW was able to make contact with patient's sister, [name] today, to follow-up regarding social work services and resources for patient, as well as to continue to provide assistance with long-term care placement arrangements into an assisted living facility.  Mrs. [name] admitted that she is still in the process of gathering all the information necessary to submit her mother's application for [place] Medicaid, before contacting patient's Medicaid case worker to change patient's Adult Medicaid coverage to [place] Medicaid coverage.  Mrs. [name] requested that [place] mail her two additional [place] Medicaid applications, as she indicated having to make so many corrections to the previous applications (for patient and patient's mother) that she would prefer to just start all over.  CSW voiced understanding, agreeing to place 2 applications in the mail to Mrs. [name] today.  Mrs. [name] further reported that she and her siblings are still trying to get their mother's home ready to place on the market, as patient currently resides in the home with her mother.  Mrs. [name] recommended that she be able to contact CSW directly when she is ready to proceed with the placement process, ensuring that she has the correct contact information for CSW.  CSW was agreeable to this plan, but suggested that she be able to follow-up with Mrs. [name] again in one month, on Monday, June 22, 2020, around 10:00AM, if a return call has not been received from Mrs. [name] by then, just to check the status of patient's application for Special Assistance Long-Term Care Medicaid.  In addition, CSW will have Mrs. [name] report findings of their progress with fixing up their mother's home, as well as to find out when they plan to put in on the market.  [name],  BSW, MSW, LCSW  Licensed Education officer, environmental Health System  Mailing [place] [address] [place], [place] [address] Physical Address[address], [place], [place] [address] Toll Free Main # [phone] Fax # [phone] Cell # [phone]  Di [email or web address]

## 2020-06-15 ENCOUNTER — Ambulatory Visit: Payer: Medicare Other | Admitting: *Deleted

## 2020-06-22 ENCOUNTER — Encounter: Payer: Self-pay | Admitting: *Deleted

## 2020-06-22 ENCOUNTER — Other Ambulatory Visit: Payer: Self-pay | Admitting: *Deleted

## 2020-06-22 NOTE — Patient Outreach (Signed)
[facility]) Care Management  06/22/2020  [place] 04/28/1981 [id number]   CSW was able to make contact with patient's sister, [name] today, to follow-up regarding long-term care placement arrangements for patient.  Mrs. Teachey admitted, that despite her and her siblings recommendations otherwise, patient's mother, Kasarah Sitts refuses to sell her home or consider placement, of any kind, for her and patient, at the present time.  Mrs. [name] went on to explain that Ms. Honeycutt is extremely protective of her disabled sister, agreeing to continue to care for her in the home, for fear that they will be separated from one-another, or that patient's condition would deteriorate if she were to be placed into a whole new environment.  Mrs. [name] reported that she will continue to be the primary caregiver for both patient and her mother, receiving intermittent care from her siblings, until they are simply unable to care for themselves anymore, then she will pursue a higher level of care for them both.    CSW will perform a case closure on patient, as all goals of treatment have been met from social work standpoint and no additional social work needs have been identified at this time, as patient's mother is not ready to move forward with long-term care placement arrangements.  CSW will notify patient's Doyle with [facility], Emelia Loron of CSW's plans to close patient's case.  CSW will fax an update to patient's Primary Care Physician, Dr. Pricilla Holm to ensure that she is aware of CSW's involvement with patient's plan of care, in addition to sending a Physician Case Closure Letter.  CSW was able to confirm that Mrs. [name] has the correct contact information for CSW, encouraging her to contact CSW directly if patient changes her mind, or if additional social work needs arise in the near future.  [name], BSW, MSW, LCSW  Licensed [facility]  Mailing [place] [address] [place], [place] [address] Physical Address[address], [place], [place] [address] Toll Free Main # [phone] Fax # [phone] Cell # [phone]  Di [email or web address]

## 2020-08-10 ENCOUNTER — Ambulatory Visit (INDEPENDENT_AMBULATORY_CARE_PROVIDER_SITE_OTHER): Payer: Medicare Other | Admitting: Internal Medicine

## 2020-08-10 ENCOUNTER — Encounter: Payer: Self-pay | Admitting: Internal Medicine

## 2020-08-10 ENCOUNTER — Other Ambulatory Visit: Payer: Self-pay

## 2020-08-10 VITALS — BP 112/60 | HR 83 | Temp 98.5°F | Resp 18 | Ht 63.0 in | Wt 95.0 lb

## 2020-08-10 DIAGNOSIS — Z0000 Encounter for general adult medical examination without abnormal findings: Secondary | ICD-10-CM | POA: Diagnosis not present

## 2020-08-10 DIAGNOSIS — M79642 Pain in left hand: Secondary | ICD-10-CM

## 2020-08-10 DIAGNOSIS — F79 Unspecified intellectual disabilities: Secondary | ICD-10-CM | POA: Diagnosis not present

## 2020-08-10 NOTE — Patient Instructions (Signed)
We will not check any labs today.   Health Maintenance, Female Adopting a healthy lifestyle and getting preventive care are important in promoting health and wellness. Ask your health care provider about:  The right schedule for you to have regular tests and exams.  Things you can do on your own to prevent diseases and keep yourself healthy. What should I know about diet, weight, and exercise? Eat a healthy diet  Eat a diet that includes plenty of vegetables, fruits, low-fat dairy products, and lean protein.  Do not eat a lot of foods that are high in solid fats, added sugars, or sodium.   Maintain a healthy weight Body mass index (BMI) is used to identify weight problems. It estimates body fat based on height and weight. Your health care provider can help determine your BMI and help you achieve or maintain a healthy weight. Get regular exercise Get regular exercise. This is one of the most important things you can do for your health. Most adults should:  Exercise for at least 150 minutes each week. The exercise should increase your heart rate and make you sweat (moderate-intensity exercise).  Do strengthening exercises at least twice a week. This is in addition to the moderate-intensity exercise.  Spend less time sitting. Even light physical activity can be beneficial. Watch cholesterol and blood lipids Have your blood tested for lipids and cholesterol at 42 years of age, then have this test every 5 years. Have your cholesterol levels checked more often if:  Your lipid or cholesterol levels are high.  You are older than 42 years of age.  You are at high risk for heart disease. What should I know about cancer screening? Depending on your health history and family history, you may need to have cancer screening at various ages. This may include screening for:  Breast cancer.  Cervical cancer.  Colorectal cancer.  Skin cancer.  Lung cancer. What should I know about heart  disease, diabetes, and high blood pressure? Blood pressure and heart disease  High blood pressure causes heart disease and increases the risk of stroke. This is more likely to develop in people who have high blood pressure readings, are of African descent, or are overweight.  Have your blood pressure checked: ? Every 3-5 years if you are 58-58 years of age. ? Every year if you are 56 years old or older. Diabetes Have regular diabetes screenings. This checks your fasting blood sugar level. Have the screening done:  Once every three years after age 58 if you are at a normal weight and have a low risk for diabetes.  More often and at a younger age if you are overweight or have a high risk for diabetes. What should I know about preventing infection? Hepatitis B If you have a higher risk for hepatitis B, you should be screened for this virus. Talk with your health care provider to find out if you are at risk for hepatitis B infection. Hepatitis C Testing is recommended for:  Everyone born from [birth year] through 1967.  Anyone with known risk factors for hepatitis C. Sexually transmitted infections (STIs)  Get screened for STIs, including gonorrhea and chlamydia, if: ? You are sexually active and are younger than 41 years of age. ? You are older than 42 years of age and your health care provider tells you that you are at risk for this type of infection. ? Your sexual activity has changed since you were last screened, and you are at increased risk  for chlamydia or gonorrhea. Ask your health care provider if you are at risk.  Ask your health care provider about whether you are at high risk for HIV. Your health care provider may recommend a prescription medicine to help prevent HIV infection. If you choose to take medicine to prevent HIV, you should first get tested for HIV. You should then be tested every 3 months for as long as you are taking the medicine. Pregnancy  If you are about to stop  having your period (premenopausal) and you may become pregnant, seek counseling before you get pregnant.  Take 400 to 800 micrograms (mcg) of folic acid every day if you become pregnant.  Ask for birth control (contraception) if you want to prevent pregnancy. Osteoporosis and menopause Osteoporosis is a disease in which the bones lose minerals and strength with aging. This can result in bone fractures. If you are 71 years old or older, or if you are at risk for osteoporosis and fractures, ask your health care provider if you should:  Be screened for bone loss.  Take a calcium or vitamin D supplement to lower your risk of fractures.  Be given hormone replacement therapy (HRT) to treat symptoms of menopause. Follow these instructions at home: Lifestyle  Do not use any products that contain nicotine or tobacco, such as cigarettes, e-cigarettes, and chewing tobacco. If you need help quitting, ask your health care provider.  Do not use street drugs.  Do not share needles.  Ask your health care provider for help if you need support or information about quitting drugs. Alcohol use  Do not drink alcohol if: ? Your health care provider tells you not to drink. ? You are pregnant, may be pregnant, or are planning to become pregnant.  If you drink alcohol: ? Limit how much you use to 0-1 drink a day. ? Limit intake if you are breastfeeding.  Be aware of how much alcohol is in your drink. In the [place] one drink equals one 12 oz bottle of beer (355 mL), one 5 oz glass of wine (148 mL), or one 1 oz glass of hard liquor (44 mL). General instructions  Schedule regular health, dental, and eye exams.  Stay current with your vaccines.  Tell your health care provider if: ? You often feel depressed. ? You have ever been abused or do not feel safe at home. Summary  Adopting a healthy lifestyle and getting preventive care are important in promoting health and wellness.  Follow your health  care provider's instructions about healthy diet, exercising, and getting tested or screened for diseases.  Follow your health care provider's instructions on monitoring your cholesterol and blood pressure. This information is not intended to replace advice given to you by your health care provider. Make sure you discuss any questions you have with your health care provider. Document Revised: 05/09/2018 Document Reviewed: 05/09/2018 Elsevier Patient Education  2021 [employer].

## 2020-08-10 NOTE — Progress Notes (Unsigned)
   Subjective:   Patient ID: Jacqueline Hebert, female    DOB: Nov 06, 1980, 40 y.o.   MRN: [id number]  HPI The patient is a 40 YO female coming in for physical. 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 She is incontinent all the time. She is now living with her aunt temporarily. Prior guardian her grandmother who is now having health problems and is unable to care for her.   PMH, [facility], social history reviewed and updated  Review of Systems  Unable to perform ROS: Patient nonverbal    Objective:  Physical Exam Constitutional:      Appearance: Normal appearance. She is well-developed and normal weight.  HENT:     Head: Normocephalic and atraumatic.  Cardiovascular:     Rate and Rhythm: Normal rate and regular rhythm.  Pulmonary:     Effort: No respiratory distress.     Breath sounds: Normal breath sounds. No wheezing or rales.  Abdominal:     General: Bowel sounds are normal. There is no distension.     Palpations: Abdomen is soft.     Tenderness: There is no abdominal tenderness. There is no rebound.  Musculoskeletal:     Cervical back: Normal range of motion.  Skin:    General: Skin is warm and dry.  Neurological:     Mental Status: She is alert. Mental status is at baseline.     Coordination: Coordination normal.     Vitals:   08/10/20 1548  BP: 112/60  Pulse: 83  Resp: 18  Temp: 98.5 F (36.9 C)  TempSrc: Oral  SpO2: 98%  Weight: 95 lb (43.1 kg)  Height: 5\' 3"  (1.6 m)    This visit occurred during the SARS-CoV-2 public health emergency.  Safety protocols were in place, including screening questions prior to the visit, additional usage of staff PPE, and extensive cleaning of exam room while observing appropriate contact time as indicated for disinfecting solutions.   Assessment & Plan:

## 2020-08-11 NOTE — Assessment & Plan Note (Signed)
Flu shot up to date. Covid-19 up to date. Tetanus up to date. Mammogram declines, pap smear declines. Counseled about sun safety and mole surveillance. Counseled about the dangers of distracted driving. Given 10 year screening recommendations.

## 2020-08-11 NOTE — Assessment & Plan Note (Signed)
Using meloxicam and this seems to help make her more comfortable.

## 2020-08-11 NOTE — Assessment & Plan Note (Signed)
Stable and is incontinent at all times.

## 2020-08-25 ENCOUNTER — Telehealth: Payer: Self-pay | Admitting: Internal Medicine

## 2020-08-25 NOTE — Telephone Encounter (Signed)
Patients Emergency Contact, [name] called and is requesting a call back in regards to discussing the patients long term care. She can be reached at [phone].

## 2020-08-26 NOTE — Telephone Encounter (Signed)
Spoke with the [name] and she stated that the people that are suppose to help with the patient through the agency don't show up when they are suppose to which leaves her to do everything for the patient. She would like to know what would be the process to get the patient into a group home for other's like her. She stated that she is starting to get mentally and physically overwhelmed caring for the patient, she became emotional on the phone. Please advise.

## 2020-08-27 NOTE — Telephone Encounter (Signed)
Can you give her the contact information from the social worker she has talked to in the past? Should be in the chart there are multiple notes from the social woker.

## 2020-08-28 NOTE — Telephone Encounter (Signed)
Unable to get in contact with the [name]. LDVM with the social workers name and office number. Office number was provided in case she has additional questions or concerns.

## 2020-09-28 ENCOUNTER — Other Ambulatory Visit: Payer: Self-pay | Admitting: *Deleted

## 2020-09-28 NOTE — Patient Outreach (Signed)
[facility]) Care Management  09/28/2020  [place] [birth year] [id number]  Successful telephone outreach call to patient's sister [name]. HIPAA identifiers obtained. [name] explained that the patient's adoptive mother Verlia Kaney is in a long-term facility and has end-stage dementia. Ms. [name] has been caring for the patient who has mental impairment, is non-verbal, and incontinent. Ms. [name] reports that she is not able to continue to care for the patient and wants to place the patient in a facility. [name] has been told by Medicaid she does not have authority of care and will need assistance with obtaining guardianship for the patient. Nurse will place a referral to [facility] for assistance.  Plan: Refer to CSW to assist [name] with obtaining guardianship and placement for patient.  Emelia Loron RN, [place] [phone] [email or web address]

## 2020-10-12 ENCOUNTER — Telehealth: Payer: Self-pay | Admitting: Internal Medicine

## 2020-10-12 NOTE — Telephone Encounter (Signed)
Home care delivered calling, states that on 05.10.22 some paperwork was faxed to [place] because they need a certificate of medical necessity and a physicians order signed. They were checking on the status.

## 2020-10-23 NOTE — Telephone Encounter (Signed)
Paperwork has been refaxed 

## 2020-10-23 NOTE — Telephone Encounter (Signed)
Home care delivered calling and said that they faxed some paper work on 10/19/20. They said that it was for a medical necessity and a physicians order. They said that there were changes to questions 19, the patients height and weight. They said that it was initialed but not dated. They are asking of the status. Please advise   Phone: [phone]

## 2020-12-17 ENCOUNTER — Telehealth: Payer: Self-pay

## 2020-12-17 NOTE — Telephone Encounter (Signed)
pts sister has stated she is in need of help on a permanent placement for the pt. Pts sister states their mother had started the process but was not able to finish due to her health taking a turn in the middle of the process.   **Sent to [place].

## 2020-12-18 NOTE — Telephone Encounter (Signed)
Is a visit needed?

## 2020-12-22 NOTE — Telephone Encounter (Signed)
We can fill out Fl2 if needed. Is this what is needed? I don't see a lot of clarifying info on what is requested.

## 2020-12-24 NOTE — Telephone Encounter (Signed)
Called patient' sister. LVM asking her to return my call here at the office. Office number was provided. I will contact again later on today.

## 2021-01-07 ENCOUNTER — Encounter: Payer: Self-pay | Admitting: Internal Medicine

## 2021-01-07 ENCOUNTER — Ambulatory Visit (INDEPENDENT_AMBULATORY_CARE_PROVIDER_SITE_OTHER): Payer: Medicare Other | Admitting: Internal Medicine

## 2021-01-07 ENCOUNTER — Other Ambulatory Visit: Payer: Self-pay

## 2021-01-07 ENCOUNTER — Telehealth: Payer: Self-pay | Admitting: *Deleted

## 2021-01-07 DIAGNOSIS — F79 Unspecified intellectual disabilities: Secondary | ICD-10-CM

## 2021-01-07 NOTE — Chronic Care Management (AMB) (Signed)
  Chronic Care Management   Note  01/07/2021 Name: Jacqueline Hebert MRN: [id number] DOB: [birth year]  Jacqueline Hebert is a 40 y.o. year old female who is a primary care patient of [name], MD. I reached out to Orvan July by phone today in response to a referral sent by Ms. Jacqueline Scull Hobby's PCP [name], MD     [name] was given information about Chronic Care Management services today including:  CCM service includes personalized support from designated clinical staff supervised by her physician, including individualized plan of care and coordination with other care providers 24/7 contact phone numbers for assistance for urgent and routine care needs. Service will only be billed when office clinical staff spend 20 minutes or more in a month to coordinate care. Only one practitioner may furnish and bill the service in a calendar month. The patient may stop CCM services at any time (effective at the end of the month) by phone call to the office staff. The patient will be responsible for cost sharing (co-pay) of up to 20% of the service fee (after annual deductible is met).  Patient agreed to services and verbal consent obtained.   Follow up plan: Telephone appointment with care management team member scheduled for: 01/14/2021  Julian Hy, [facility]  Direct Dial: [phone]

## 2021-01-07 NOTE — Progress Notes (Signed)
   Subjective:   Patient ID: Jacqueline Hebert, female    DOB: [birth year], 40 y.o.   MRN: [id number]  HPI The patient is a 40 YO female coming in for placement and mild ankle swelling. Elenor Legato is guardian and gives history.   Review of Systems  Unable to perform ROS: Patient nonverbal   Objective:  Physical Exam Constitutional:      Appearance: She is well-developed.  HENT:     Head: Normocephalic and atraumatic.  Cardiovascular:     Rate and Rhythm: Normal rate and regular rhythm.  Pulmonary:     Effort: Pulmonary effort is normal. No respiratory distress.     Breath sounds: Normal breath sounds. No wheezing or rales.  Abdominal:     General: Bowel sounds are normal. There is no distension.     Palpations: Abdomen is soft.     Tenderness: There is no abdominal tenderness. There is no rebound.  Musculoskeletal:     Cervical back: Normal range of motion.  Skin:    General: Skin is warm and dry.     Comments: Some foot deformity with hammertoes, no callusing or skin breakdown  Neurological:     Mental Status: She is alert.     Coordination: Coordination normal.    Vitals:   01/07/21 1523  BP: 108/70  Pulse: 77  Resp: 18  Temp: 97.8 F (36.6 C)  TempSrc: Oral  SpO2: 99%  Weight: 94 lb 9.6 oz (42.9 kg)  Height: '5\' 3"'$  (1.6 m)    This visit occurred during the SARS-CoV-2 public health emergency.  Safety protocols were in place, including screening questions prior to the visit, additional usage of staff PPE, and extensive cleaning of exam room while observing appropriate contact time as indicated for disinfecting solutions.   Assessment & Plan:

## 2021-01-08 NOTE — Assessment & Plan Note (Signed)
Needs placement and FL2 form filled out during visit and faxed to [employer] [facility], [employer].

## 2021-01-14 ENCOUNTER — Ambulatory Visit: Payer: Medicare Other | Admitting: *Deleted

## 2021-01-15 NOTE — Patient Instructions (Signed)
Visit Information  PATIENT GOALS:  Goals Addressed             This Visit's Progress    Find Help in My Community       Timeframe:  Long-Range Goal Priority:  High Start Date:         01/15/21                     Expected End Date:       03/29/21                Follow Up Date 01/26/21   -follow up with CAPS/DSS worker for help with increasing hours, options, placement?  - begin a notebook of services in my neighborhood or community - call 211 when I need some help - follow-up on any referrals for help I am given - think ahead to make sure my need does not become an emergency - have a back-up plan - make a list of family or friends that I can call    Why is this important?   Knowing how and where to find help for yourself or family in your neighborhood and community is an important skill.  You will want to take some steps to learn how.    Notes:         The patient verbalized understanding of instructions, educational materials, and care plan provided today and declined offer to receive copy of patient instructions, educational materials, and care plan.   Telephone follow up appointment with care management team member scheduled for:01/26/21 Eduard Clos MSW, LCSW Licensed Clinical Social Worker [place] [phone]

## 2021-01-15 NOTE — Chronic Care Management (AMB) (Cosign Needed)
Chronic Care Management    Clinical Social Work Note  01/15/2021 Name: Jacqueline Hebert MRN: [id number] DOB: 12/02/1980  Jacqueline Hebert is a 40 y.o. year old female who is a primary care patient of Sharlet Salina Real Cons, MD. The CCM team was consulted to assist the patient with chronic disease management and/or care coordination needs related to: [employer] , Level of Care Concerns, and Caregiver Stress.   Engaged with patient by telephone for initial visit in response to provider referral for social work chronic care management and care coordination services.   Consent to Services:  The patient was given information about Chronic Care Management services, agreed to services, and gave verbal consent prior to initiation of services.  Please see initial visit note for detailed documentation.   Patient agreed to services and consent obtained.   Assessment: Review of patient past medical history, allergies, medications, and health status, including review of relevant consultants reports was performed today as part of a comprehensive evaluation and provision of chronic care management and care coordination services.     SDOH (Social Determinants of Health) assessments and interventions performed:    Advanced Directives Status: See Care Plan for related entries.  CCM Care Plan  No Known Allergies  Outpatient Encounter Medications as of 01/14/2021  Medication Sig   meloxicam (MOBIC) 15 MG tablet TAKE 1 TABLET ONCE DAILY.   No facility-administered encounter medications on file as of 01/14/2021.    Patient Active Problem List   Diagnosis Date Noted   Left hand pain 09/24/2019   Mental impairment 02/05/2015   Routine general medical examination at a health care facility 02/05/2015    Conditions to be addressed/monitored:  level of care concerns/needs ; Level of care concerns, ADL IADL limitations, Mental Health Concerns , Limited access to caregiver, and Cognitive Deficits  Care Plan  : LCSW Plan of Care  Updates made by Deirdre Peer, LCSW since 01/15/2021 12:00 AM     Problem: Social and Functional Symptoms impacting ability for in-home management   Priority: High     Long-Range Goal: Assist with coordination of resource support to enhance patient's Social and Functional well-being   Start Date: 01/14/2021  Expected End Date: 03/29/2021  This Visit's Progress: On track  Priority: High  Note:   Current Barriers:   Patient's health has declined and longer able to meet ADL's at home Acknowledges deficits with education and support in order to meet this need Unable to independently care for self  Lacks social connections Unable to perform ADLs independently Family/caregiver stress Limited social support, Level of care concerns, Mental Health Concerns , Limited access to caregiver, Cognitive Deficits, Memory Deficits, and Inability to perform ADL's independently Clinical Goal(s): Over the next 30 to 45 days patient will be placed in a facility, patient and family will work with LCSW, to coordinate needs for  level of care  TBD  Placement Interventions: Collaboration with [name], MD regarding development and update of comprehensive plan of care as evidenced by provider attestation and co-signature Inter-disciplinary care team collaboration (see longitudinal plan of care) Assessed needs and provided education on level of care and facility placement process Obtained patient's verbal permission to fax FL2 to facilities and obtain PASSAR  Reviewed and initiated process to obtain PASSAR Collaborated with primary care provider for completion of FL2 collaborate with identified facilities and assist patient as needed to understand facility selection process Assisted patient with faxing FL2 to identified facilities once completed and signed by  PCP Patient interviewed and appropriate assessments performed Discussed plans with patient for ongoing care management  follow up and provided patient with direct contact information for care management team Advised sister to inquire and follow up with CAPS/DSS worker regarding additional services/support  Assisted patient/caregiver with obtaining information about health plan benefits Provided education to patient/caregiver regarding level of care options. Patient Goals/Self-Care Activities  -follow up with CAPS/DSS worker for help with increasing hours, options, placement?  - begin a notebook of services in my neighborhood or community - call 211 when I need some help - follow-up on any referrals for help I am given - think ahead to make sure my need does not become an emergency - have a back-up plan - make a list of family or friends that I can call  Over the next 30 days, patient and/or designated caregiver will:   - pursue options for facility placement, in-home options, etc with CAPS/DSS and other  Follow Up Plan: Telephone follow up appointment with care management team member scheduled for:01/26/21                   Follow Up Plan: Appointment scheduled for SW follow up with client by phone on: 01/26/21      Eduard Clos MSW, LCSW Licensed Clinical Social Worker [place]

## 2021-01-26 ENCOUNTER — Ambulatory Visit: Payer: Medicare Other | Admitting: *Deleted

## 2021-01-26 DIAGNOSIS — F79 Unspecified intellectual disabilities: Secondary | ICD-10-CM

## 2021-01-27 NOTE — Patient Instructions (Signed)
Visit Information  PATIENT GOALS:  Goals Addressed   None     The patient verbalized understanding of instructions, educational materials, and care plan provided today and declined offer to receive copy of patient instructions, educational materials, and care plan.   Telephone follow up appointment with care management team member scheduled for:02/04/21  Eduard Clos MSW, LCSW Licensed Clinical Social Worker [place] [phone]

## 2021-01-27 NOTE — Chronic Care Management (AMB) (Signed)
Chronic Care Management    Clinical Social Work Note  01/27/2021 Name: Jacqueline Hebert MRN: [id number] DOB: [birth year]  Jacqueline Hebert is a 40 y.o. year old female who is a primary care patient of Sharlet Salina Real Cons, MD. The CCM team was consulted to assist the patient with chronic disease management and/or care coordination needs related to: [employer]  and Level of Care Concerns.   Engaged with patient by telephone for follow up visit in response to provider referral for social work chronic care management and care coordination services.   Consent to Services:  The patient was given information about Chronic Care Management services, agreed to services, and gave verbal consent prior to initiation of services.  Please see initial visit note for detailed documentation.   Patient agreed to services and consent obtained.   Assessment: Review of patient past medical history, allergies, medications, and health status, including review of relevant consultants reports was performed today as part of a comprehensive evaluation and provision of chronic care management and care coordination services.     SDOH (Social Determinants of Health) assessments and interventions performed:    Advanced Directives Status: See Care Plan for related entries.  CCM Care Plan  No Known Allergies  Outpatient Encounter Medications as of 01/26/2021  Medication Sig   meloxicam (MOBIC) 15 MG tablet TAKE 1 TABLET ONCE DAILY.   No facility-administered encounter medications on file as of 01/26/2021.    Patient Active Problem List   Diagnosis Date Noted   Left hand pain 09/24/2019   Mental impairment 02/05/2015   Routine general medical examination at a health care facility 02/05/2015    Conditions to be addressed/monitored: Dementia and Guardianship; Level of care concerns and Limited access to caregiver  Care Plan : LCSW Plan of Care  Updates made by Deirdre Peer, LCSW since 01/27/2021 12:00 AM      Problem: Social and Functional Symptoms impacting ability for in-home management   Priority: High     Long-Range Goal: Assist with coordination of resource support to enhance patient's Social and Functional well-being   Start Date: 01/14/2021  Expected End Date: 03/29/2021  This Visit's Progress: On track  Recent Progress: On track  Priority: High  Note:   Current Barriers:   Patient's health has declined and longer able to meet ADL's at home Acknowledges deficits with education and support in order to meet this need Unable to independently care for self  Lacks social connections Unable to perform ADLs independently Family/caregiver stress Limited social support, Level of care concerns, Mental Health Concerns , Limited access to caregiver, Cognitive Deficits, Memory Deficits, and Inability to perform ADL's independently Clinical Goal(s): Over the next 30 to 45 days patient will be placed in a facility, patient and family will work with LCSW, to coordinate needs for  level of care  TBD  Placement Interventions: CSW had lengthy talk with pt's sister, [name], who is pt's Legal Guardian. [name] has been primary caregiver for pt since their mother became unable- she is now in a SNF and "does not even recognize me anymore".  Pt's sister is exhausted and feels she is no longer able to provide care in the home for pt- the CAPS services provides 30 hours per week of caregiving but [name] has found this is not sufficient and wants to pursue placement of pt.   CSW will assist PCP office with editing pt's FL2 to ensure it is complete and ready for bed search.   CSW reminded  her of the challenges to getting her placed; to include facility options, Medicaid beds, location and other.  CSW encouraged daughter to call CAPS worker to inquire about their options for increasing hours and other possible resources they can offer through [place] with Sharlet Salina Real Cons, MD regarding development and  update of comprehensive plan of care as evidenced by provider attestation and co-signature Inter-disciplinary care team collaboration (see longitudinal plan of care) Assessed needs and provided education on level of care and facility placement process Obtained patient's verbal permission to fax FL2 to facilities and obtain PASSAR  Reviewed and initiated process to obtain [place] with primary care provider for completion of [place] collaborate with identified facilities and assist patient as needed to understand facility selection process Assisted patient with faxing FL2 to identified facilities once completed and signed by PCP Patient interviewed and appropriate assessments performed Discussed plans with patient for ongoing care management follow up and provided patient with direct contact information for care management team Advised sister to inquire and follow up with CAPS/DSS worker regarding additional services/support  Assisted patient/caregiver with obtaining information about health plan benefits Provided education to patient/caregiver regarding level of care options. Patient Goals/Self-Care Activities  -follow up with CAPS/DSS worker for help with increasing hours, options, placement?  - begin a notebook of services in my neighborhood or community - call 211 when I need some help - follow-up on any referrals for help I am given - think ahead to make sure my need does not become an emergency - have a back-up plan - make a list of family or friends that I can call  Over the next 30 days, patient and/or designated caregiver will:   - pursue options for facility placement, in-home options, etc with CAPS/DSS and other  Follow Up Plan: Telephone follow up appointment with care management team member scheduled for:02/04/21                   Follow Up Plan: Appointment scheduled for SW follow up with client by phone on: 02/04/21      Eduard Clos MSW, [place] Licensed  Clinical Social Worker [place] [phone]

## 2021-02-03 ENCOUNTER — Ambulatory Visit: Payer: Medicare Other

## 2021-02-04 ENCOUNTER — Ambulatory Visit (INDEPENDENT_AMBULATORY_CARE_PROVIDER_SITE_OTHER): Payer: Medicare Other

## 2021-02-04 DIAGNOSIS — Z0000 Encounter for general adult medical examination without abnormal findings: Secondary | ICD-10-CM | POA: Diagnosis not present

## 2021-02-04 NOTE — Progress Notes (Signed)
I connected with Jacqueline Hebert (guardian to patient, Jacqueline Hebert) today by telephone and verified that I am speaking with the correct person using two identifiers. Location patient: home Location provider: work Persons participating in the virtual visit: Jacqueline Hebert (legal guardian) and Jacqueline Abu, LPN.   I discussed the limitations, risks, security and privacy concerns of performing an evaluation and management service by telephone and the availability of in person appointments. I also discussed with the patient that there may be a patient responsible charge related to this service. The patient expressed understanding and verbally consented to this telephonic visit.    Interactive audio and video telecommunications were attempted between this provider and patient, however failed, due to patient having technical difficulties OR patient did not have access to video capability.  We continued and completed visit with audio only.  Some vital signs may be absent or patient reported.   Time Spent with patient on telephone encounter: 30 minutes  Subjective:   Jacqueline Hebert is a 40 y.o. female who presents for Medicare Annual (Subsequent) preventive examination.  Review of Systems     Cardiac Risk Factors include: none     Objective:    There were no vitals filed for this visit. There is no height or weight on file to calculate BMI.  Advanced Directives 02/04/2021 04/30/2020 04/28/2020 11/21/2018 11/21/2018 04/10/2015  Does Patient Have a Medical Advance Directive? Yes Yes Yes No Yes Yes  Type of Advance Directive - Healthcare Power of [place];Living will -  Does patient want to make changes to medical advance directive? No - Patient declined No - Guardian declined No - Guardian declined - - -  Copy of Healthcare Power of Attorney in Chart? - No - copy requested - - Yes - validated most recent copy scanned in chart (See row  information) -  Would patient like information on creating a medical advance directive? - - - No - Guardian declined - -    Current Medications (verified) Outpatient Encounter Medications as of 02/04/2021  Medication Sig   meloxicam (MOBIC) 15 MG tablet TAKE 1 TABLET ONCE DAILY.   No facility-administered encounter medications on file as of 02/04/2021.    Allergies (verified) Patient has no known allergies.   History: Past Medical History:  Diagnosis Date   Incontinence of urine    History reviewed. No pertinent surgical history. Family History  Family history unknown: Yes   Social History   Socioeconomic History   Marital status: Single    Spouse name: Not on file   Number of children: 0   Years of education: Special Schooling   Highest education level: Never attended school  Occupational History   Occupation: Disability  Tobacco Use   Smoking status: Never   Smokeless tobacco: Never  Vaping Use   Vaping Use: Never used  Substance and Sexual Activity   Alcohol use: No    Alcohol/week: 0.0 standard drinks   Drug use: No   Sexual activity: Not Currently  Other Topics Concern   Not on file  Social History Narrative   Patient is non-verbal and her adoptive mother is her full-time caregiver.   Social Determinants of Health   Financial Resource Strain: Low Risk    Difficulty of Paying Living Expenses: Not hard at all  Food Insecurity: No Food Insecurity   Worried About Charity fundraiser in the Last Year: Never true   [place] in the  Last Year: Never true  Transportation Needs: No Transportation Needs   Lack of Transportation (Medical): No   Lack of Transportation (Non-Medical): No  Physical Activity: Inactive   Days of Exercise per Week: 0 days   Minutes of Exercise per Session: 0 min  Stress: No Stress Concern Present   Feeling of Stress : Not at all  Social Connections: Moderately Isolated   Frequency of Communication with Friends and Family: Never    Frequency of Social Gatherings with Friends and Family: More than three times a week   Attends Religious Services: More than 4 times per year   Active Member of [employer] or Organizations: No   Attends Music therapist: Never   Marital Status: Never married    Tobacco Counseling Counseling given: Not Answered   Clinical Intake:  Pre-visit preparation completed: Yes  Pain : No/denies pain     Nutritional Risks: None Diabetes: No  How often do you need to have someone help you when you read instructions, pamphlets, or other written materials from your doctor or pharmacy?: 1 - Never What is the last grade level you completed in school?: no formal education  Diabetic? no  Interpreter Needed?: No  Information entered by :: Jacqueline Abu, LPN   Activities of Daily Living In your present state of health, do you have any difficulty performing the following activities: 02/04/2021 04/30/2020  Hearing? N N  Vision? N N  Difficulty concentrating or making decisions? Y Y  Comment - Mental impairment  Walking or climbing stairs? Tempie Donning  Comment - Receives assistance from sister - [name]  Dressing or bathing? Tempie Donning  Comment - Receives assistance from sister - [name]  Doing errands, shopping? Tempie Donning  Comment - Receives assistance from sister - Izell [place] and eating ? Tempie Donning  Comment - Receives assistance from sister - [name]  Using the Toilet? Y Y  Comment - Receives assistance from sister - [name]  In the past six months, have you accidently leaked urine? Y Y  Comment - Receives assistance from sister - [name]  Do you have problems with loss of bowel control? Tempie Donning  Comment - Receives assistance from sister - [name]  Managing your Medications? Tempie Donning  Comment - Receives assistance from sister - [name]  Managing your Finances? Tempie Donning  Comment - Receives assistance from sister - [name]  Housekeeping or managing your Housekeeping? Tempie Donning   Comment - Receives assistance from sister - [name]  Some recent data might be hidden    Patient Care Team: [name], MD as PCP - General (Internal Medicine) Deirdre Peer, LCSW as Social Worker (Licensed Clinical Social Worker)  Indicate any recent [employer] you may have received from other than [facility] providers in the past year (date may be approximate).     Assessment:   This is a routine wellness examination for Cristal.  Hearing/Vision screen Hearing Screening - Comments:: Patient does not wears any hearing aids. Vision Screening - Comments:: Patient does not wear any eye glasses or contacts.  Dietary issues and exercise activities discussed: Current Exercise Habits: The patient does not participate in regular exercise at present, Exercise limited by: Other - see comments (mental impairment)   Goals Addressed   None   Depression Screen PHQ 2/9 Scores 02/04/2021 04/30/2020 04/30/2020 11/21/2018 04/10/2015  PHQ - 2 Score 0 0 0 - 0  Exception Documentation - -  Other- indicate reason in comment box Other- indicate reason in comment box -  Not completed - - All information according to daughter - [name] - -    Fall Risk Fall Risk  02/04/2021 04/30/2020 04/30/2020 11/21/2018 04/10/2015  Falls in the past year? 0 0 0 0 No  Comment - - - mother states no falls -  Number falls in past yr: 0 0 0 0 -  Injury with Fall? 0 0 0 - -  Risk for fall due to : No Fall Risks Impaired balance/gait;Impaired mobility;No Fall Risks Impaired balance/gait;Impaired mobility;No Fall Risks - -  Follow up Falls evaluation completed Education provided;Falls prevention discussed Education provided;Falls prevention discussed - -    FALL RISK PREVENTION PERTAINING TO THE HOME:  Any stairs in or around the home? Yes  If so, are there any without handrails? No  Home free of loose throw rugs in walkways, pet beds, electrical cords, etc? Yes  Adequate lighting in your home to reduce  risk of falls? Yes   ASSISTIVE DEVICES UTILIZED TO PREVENT FALLS:  Life alert? No  Use of a cane, walker or w/c? No  Grab bars in the bathroom? No  Shower chair or bench in shower? No  Elevated toilet seat or a handicapped toilet? No   TIMED UP AND GO:  Was the test performed? No .  Length of time to ambulate 10 feet: n/a sec.   Cognitive Function: Yes, Patient has current diagnosis of cognitive impairment.   Yes, Patient is unable to complete screening 6CIT or MMSE.   MMSE - Mini Mental State Exam 11/21/2018 04/10/2015  Not completed: Unable to complete (No Data)        Immunizations Immunization History  Administered Date(s) Administered   Influenza,inj,Quad PF,6+ Mos 02/05/2015, 02/05/2016, 08/09/2017, 08/06/2018, 06/20/2019, 03/16/2020   PFIZER(Purple Top)SARS-COV-2 Vaccination 08/22/2019, 09/16/2019, 04/14/2020   Td 09/29/2005   Tetanus 11/28/2014    TDAP status: never done  Flu Vaccine status: Up to date, due Fall 2022  Pneumococcal Vaccine status: never done  Covid-19 vaccine status: Completed vaccines  Qualifies for Shingles Vaccine? No   Zostavax completed No   Shingrix Completed?: No.    Education has been provided regarding the importance of this vaccine. Patient has been advised to call insurance company to determine out of pocket expense if they have not yet received this vaccine. Advised may also receive vaccine at local pharmacy or Health Dept. Verbalized acceptance and understanding.  Screening Tests Health Maintenance  Topic Date Due   Hepatitis C Screening  Never done   INFLUENZA VACCINE  12/28/2020   HIV Screening  08/10/2048 (Originally 01/25/1994)   PAP SMEAR-Modifier  11/20/2048 (Originally 01/26/2000)   TETANUS/TDAP  11/27/2024   COVID-19 Vaccine  Completed   Pneumococcal Vaccine 45-42 Years old  Aged Out   HPV VACCINES  Aged Out    Health Maintenance  Health Maintenance Due  Topic Date Due   Hepatitis C Screening  Never done    INFLUENZA VACCINE  12/28/2020    Colorectal screening: never done  Mammogram status: never done  Bone Density status: never done  Lung Cancer Screening: (Low Dose CT Chest recommended if Age 65-80 years, 30 pack-year currently smoking OR have quit w/in 15years.) does not qualify.   Lung Cancer Screening Referral: no  Additional Screening:  Hepatitis C Screening: does not qualify; Completed no  Vision Screening: Recommended annual ophthalmology exams for early detection of glaucoma and other disorders of the eye. Is the patient  up to date with their annual eye exam?  No  Who is the provider or what is the name of the office in which the patient attends annual eye exams? N/A If pt is not established with a provider, would they like to be referred to a provider to establish care? No .   Dental Screening: Recommended annual dental exams for proper oral hygiene  Community Resource Referral / Chronic Care Management: CRR required this visit?  No   CCM required this visit?  No      Plan:     I have personally reviewed and noted the following in the patient's chart:   Medical and social history Use of alcohol, tobacco or illicit drugs  Current medications and supplements including opioid prescriptions.  Functional ability and status Nutritional status Physical activity Advanced directives List of other physicians Hospitalizations, surgeries, and ER visits in previous 12 months Vitals Screenings to include cognitive, depression, and falls Referrals and appointments  In addition, I have reviewed and discussed with patient certain preventive protocols, quality metrics, and best practice recommendations. A written personalized care plan for preventive services as well as general preventive health recommendations were provided to patient.     Sheral Flow, LPN   [id number]   Nurse Notes:  There were no vitals filed for this visit. There is no height or weight on file  to calculate BMI.

## 2021-02-04 NOTE — Patient Instructions (Signed)
Jacqueline Hebert , Thank you for taking time to come for your Medicare Wellness Visit. I appreciate your ongoing commitment to your health goals. Please review the following plan we discussed and let me know if I can assist you in the future.   Screening recommendations/referrals: Colonoscopy: never done Mammogram: never done Bone Density: never done Recommended yearly ophthalmology/optometry visit for glaucoma screening and checkup Recommended yearly dental visit for hygiene and checkup  Vaccinations: Influenza vaccine: last done 03/16/2020; due Fall 2022 Pneumococcal vaccine: never done Tdap vaccine: never done Shingles vaccine: never done due to age  Covid-19: 08/22/2019, 09/16/2019, 04/14/2020  Advanced directives: Yes; document on file  Conditions/risks identified: Yes; no goals at this time  Next appointment: Please schedule your next Medicare Wellness Visit with your Nurse Health Advisor in 1 year by calling [phone].  Preventive Care 40-64 Years, Female Preventive care refers to lifestyle choices and visits with your health care provider that can promote health and wellness. What does preventive care include? A yearly physical exam. This is also called an annual well check. Dental exams once or twice a year. Routine eye exams. Ask your health care provider how often you should have your eyes checked. Personal lifestyle choices, including: Daily care of your teeth and gums. Regular physical activity. Eating a healthy diet. Avoiding tobacco and drug use. Limiting alcohol use. Practicing safe sex. Taking low-dose aspirin daily starting at age 10. Taking vitamin and mineral supplements as recommended by your health care provider. What happens during an annual well check? The services and screenings done by your health care provider during your annual well check will depend on your age, overall health, lifestyle risk factors, and family history of disease. Counseling  Your  health care provider may ask you questions about your: Alcohol use. Tobacco use. Drug use. Emotional well-being. Home and relationship well-being. Sexual activity. Eating habits. Work and work Statistician. Method of birth control. Menstrual cycle. Pregnancy history. Screening  You may have the following tests or measurements: Height, weight, and BMI. Blood pressure. Lipid and cholesterol levels. These may be checked every 5 years, or more frequently if you are over 43 years old. Skin check. Lung cancer screening. You may have this screening every year starting at age 55 if you have a 30-pack-year history of smoking and currently smoke or have quit within the past 15 years. Fecal occult blood test (FOBT) of the stool. You may have this test every year starting at age 82. Flexible sigmoidoscopy or colonoscopy. You may have a sigmoidoscopy every 5 years or a colonoscopy every 10 years starting at age 74. Hepatitis C blood test. Hepatitis B blood test. Sexually transmitted disease (STD) testing. Diabetes screening. This is done by checking your blood sugar (glucose) after you have not eaten for a while (fasting). You may have this done every 1-3 years. Mammogram. This may be done every 1-2 years. Talk to your health care provider about when you should start having regular mammograms. This may depend on whether you have a family history of breast cancer. BRCA-related cancer screening. This may be done if you have a family history of breast, ovarian, tubal, or peritoneal cancers. Pelvic exam and Pap test. This may be done every 3 years starting at age 28. Starting at age 28, this may be done every 5 years if you have a Pap test in combination with an HPV test. Bone density scan. This is done to screen for osteoporosis. You may have this scan if you are at  high risk for osteoporosis. Discuss your test results, treatment options, and if necessary, the need for more tests with your health care  provider. Vaccines  Your health care provider may recommend certain vaccines, such as: Influenza vaccine. This is recommended every year. Tetanus, diphtheria, and acellular pertussis (Tdap, Td) vaccine. You may need a Td booster every 10 years. Zoster vaccine. You may need this after age 86. Pneumococcal 13-valent conjugate (PCV13) vaccine. You may need this if you have certain conditions and were not previously vaccinated. Pneumococcal polysaccharide (PPSV23) vaccine. You may need one or two doses if you smoke cigarettes or if you have certain conditions. Talk to your health care provider about which screenings and vaccines you need and how often you need them. This information is not intended to replace advice given to you by your health care provider. Make sure you discuss any questions you have with your health care provider. Document Released: 06/12/2015 Document Revised: 02/03/2016 Document Reviewed: 03/17/2015 Elsevier Interactive Patient Education  2017 [place] Prevention in the Home Falls can cause injuries. They can happen to people of all ages. There are many things you can do to make your home safe and to help prevent falls. What can I do on the outside of my home? Regularly fix the edges of walkways and driveways and fix any cracks. Remove anything that might make you trip as you walk through a door, such as a raised step or threshold. Trim any bushes or trees on the path to your home. Use bright outdoor lighting. Clear any walking paths of anything that might make someone trip, such as rocks or tools. Regularly check to see if handrails are loose or broken. Make sure that both sides of any steps have handrails. Any raised decks and porches should have guardrails on the edges. Have any leaves, snow, or ice cleared regularly. Use sand or salt on walking paths during winter. Clean up any spills in your garage right away. This includes oil or grease spills. What  can I do in the bathroom? Use night lights. Install grab bars by the toilet and in the tub and shower. Do not use towel bars as grab bars. Use non-skid mats or decals in the tub or shower. If you need to sit down in the shower, use a plastic, non-slip stool. Keep the floor dry. Clean up any water that spills on the floor as soon as it happens. Remove soap buildup in the tub or shower regularly. Attach bath mats securely with double-sided non-slip rug tape. Do not have throw rugs and other things on the floor that can make you trip. What can I do in the bedroom? Use night lights. Make sure that you have a light by your bed that is easy to reach. Do not use any sheets or blankets that are too big for your bed. They should not hang down onto the floor. Have a firm chair that has side arms. You can use this for support while you get dressed. Do not have throw rugs and other things on the floor that can make you trip. What can I do in the kitchen? Clean up any spills right away. Avoid walking on wet floors. Keep items that you use a lot in easy-to-reach places. If you need to reach something above you, use a strong step stool that has a grab bar. Keep electrical cords out of the way. Do not use floor polish or wax that makes floors slippery. If  you must use wax, use non-skid floor wax. Do not have throw rugs and other things on the floor that can make you trip. What can I do with my stairs? Do not leave any items on the stairs. Make sure that there are handrails on both sides of the stairs and use them. Fix handrails that are broken or loose. Make sure that handrails are as long as the stairways. Check any carpeting to make sure that it is firmly attached to the stairs. Fix any carpet that is loose or worn. Avoid having throw rugs at the top or bottom of the stairs. If you do have throw rugs, attach them to the floor with carpet tape. Make sure that you have a light switch at the top of the  stairs and the bottom of the stairs. If you do not have them, ask someone to add them for you. What else can I do to help prevent falls? Wear shoes that: Do not have high heels. Have rubber bottoms. Are comfortable and fit you well. Are closed at the toe. Do not wear sandals. If you use a stepladder: Make sure that it is fully opened. Do not climb a closed stepladder. Make sure that both sides of the stepladder are locked into place. Ask someone to hold it for you, if possible. Clearly mark and make sure that you can see: Any grab bars or handrails. First and last steps. Where the edge of each step is. Use tools that help you move around (mobility aids) if they are needed. These include: Canes. Walkers. Scooters. Crutches. Turn on the lights when you go into a dark area. Replace any light bulbs as soon as they burn out. Set up your furniture so you have a clear path. Avoid moving your furniture around. If any of your floors are uneven, fix them. If there are any pets around you, be aware of where they are. Review your medicines with your doctor. Some medicines can make you feel dizzy. This can increase your chance of falling. Ask your doctor what other things that you can do to help prevent falls. This information is not intended to replace advice given to you by your health care provider. Make sure you discuss any questions you have with your health care provider. Document Released: 03/12/2009 Document Revised: 10/22/2015 Document Reviewed: 06/20/2014 Elsevier Interactive Patient Education  2017 [employer].

## 2021-02-05 ENCOUNTER — Ambulatory Visit: Payer: Medicare Other | Admitting: *Deleted

## 2021-02-05 DIAGNOSIS — F79 Unspecified intellectual disabilities: Secondary | ICD-10-CM

## 2021-02-05 NOTE — Chronic Care Management (AMB) (Signed)
Chronic Care Management    Clinical Social Work Note  02/05/2021 Name: Jacqueline Hebert MRN: [id number] DOB: 07/09/1980  Jacqueline Hebert is a 40 y.o. year old female who is a primary care patient of Sharlet Salina Real Cons, MD. The CCM team was consulted to assist the patient with chronic disease management and/or care coordination needs related to: [employer] .   Engaged with patient by telephone for follow up visit in response to provider referral for social work chronic care management and care coordination services.   Consent to Services:  The patient was given information about Chronic Care Management services, agreed to services, and gave verbal consent prior to initiation of services.  Please see initial visit note for detailed documentation.   Patient agreed to services and consent obtained.   Assessment: Review of patient past medical history, allergies, medications, and health status, including review of relevant consultants reports was performed today as part of a comprehensive evaluation and provision of chronic care management and care coordination services.     SDOH (Social Determinants of Health) assessments and interventions performed:    Advanced Directives Status: Not addressed in this encounter.  CCM Care Plan  No Known Allergies  Outpatient Encounter Medications as of 02/05/2021  Medication Sig   meloxicam (MOBIC) 15 MG tablet TAKE 1 TABLET ONCE DAILY.   No facility-administered encounter medications on file as of 02/05/2021.    Patient Active Problem List   Diagnosis Date Noted   Left hand pain 09/24/2019   Mental impairment 02/05/2015   Routine general medical examination at a health care facility 02/05/2015    Conditions to be addressed/monitored: Dementia; Level of care concerns  Care Plan : LCSW Plan of Care  Updates made by Deirdre Peer, LCSW since 02/05/2021 12:00 AM     Problem: Social and Functional Symptoms impacting ability for in-home  management   Priority: High     Long-Range Goal: Assist with coordination of resource support to enhance patient's Social and Functional well-being   Start Date: 01/14/2021  Expected End Date: 03/29/2021  This Visit's Progress: On track  Recent Progress: On track  Priority: High  Note:   Current Barriers:   Patient's health has declined and longer able to meet ADL's at home Acknowledges deficits with education and support in order to meet this need Unable to independently care for self  Lacks social connections Unable to perform ADLs independently Family/caregiver stress Limited social support, Level of care concerns, Mental Health Concerns , Limited access to caregiver, Cognitive Deficits, Memory Deficits, and Inability to perform ADL's independently Clinical Goal(s): Over the next 30 to 45 days patient will be placed in a facility, patient and family will work with LCSW, to coordinate needs for  level of care  TBD  Placement Interventions: CSW had lengthy talk with pt's sister, Jacqueline Hebert, who is pt's Legal Guardian. Jacqueline Hebert has been primary caregiver for pt since their mother became unable- she is now in a SNF and "does not even recognize me anymore".  Pt's sister is exhausted and feels she is no longer able to provide care in the home for pt- the CAPS services provides 30 hours per week of caregiving but Jacqueline Hebert has found this is not sufficient and wants to pursue placement of pt.   CSW will assist PCP office with editing pt's FL2 to ensure it is complete and ready for bed search.   CSW reminded  her of the challenges to getting her placed; to include facility options,  Medicaid beds, location and other.  CSW encouraged daughter to call CAPS worker to inquire about their options for increasing hours and other possible resources they can offer through [place] with Sharlet Salina Real Cons, MD regarding development and update of comprehensive plan of care as evidenced by provider attestation  and co-signature Inter-disciplinary care team collaboration (see longitudinal plan of care) Assessed needs and provided education on level of care and facility placement process Obtained patient's verbal permission to fax FL2 to facilities and obtain PASSAR  Reviewed and initiated process to obtain [place] with primary care provider for completion of [place] collaborate with identified facilities and assist patient as needed to understand facility selection process Assisted patient with faxing FL2 to identified facilities once completed and signed by PCP Patient interviewed and appropriate assessments performed Discussed plans with patient for ongoing care management follow up and provided patient with direct contact information for care management team Advised sister to inquire and follow up with CAPS/DSS worker regarding additional services/support  Assisted patient/caregiver with obtaining information about health plan benefits Provided education to patient/caregiver regarding level of care options. Patient Goals/Self-Care Activities  -follow up with CAPS/DSS worker for help with increasing hours, options, placement?  - begin a notebook of services in my neighborhood or community - call 211 when I need some help - follow-up on any referrals for help I am given - think ahead to make sure my need does not become an emergency - have a back-up plan - make a list of family or friends that I can call  Over the next 30 days, patient and/or designated caregiver will:   - pursue options for facility placement, in-home options, etc with CAPS/DSS and other  Follow Up Plan: Telephone follow up appointment with care management team member scheduled for: next week.                   Follow Up Plan: SW will follow up with patient by phone over the next week      Eduard Clos MSW, [place] Licensed Clinical Social Worker [place] [phone]

## 2021-02-05 NOTE — Patient Instructions (Signed)
Visit Information  PATIENT GOALS:  Goals Addressed   None     The patient verbalized understanding of instructions, educational materials, and care plan provided today and declined offer to receive copy of patient instructions, educational materials, and care plan.   Telephone follow up appointment with care management team member scheduled for: next week Eduard Clos MSW, LCSW Licensed Clinical Social Worker [place] [phone]

## 2021-02-08 ENCOUNTER — Telehealth: Payer: Medicare Other

## 2021-03-17 ENCOUNTER — Ambulatory Visit: Payer: Medicare Other | Admitting: *Deleted

## 2021-03-17 DIAGNOSIS — F79 Unspecified intellectual disabilities: Secondary | ICD-10-CM

## 2021-03-18 NOTE — Patient Instructions (Signed)
Visit Information  PATIENT GOALS:  Goals Addressed             This Visit's Progress    Find Help in My Community       Timeframe:  Long-Range Goal Priority:  High Start Date:         01/15/21                     Expected End Date:       05/29/21               Follow Up Date TBD  -follow up with CAPS/DSS worker for help with increasing hours, options, placement?  - begin a notebook of services in my neighborhood or community - call 211 when I need some help - follow-up on any referrals for help I am given - think ahead to make sure my need does not become an emergency - have a back-up plan - make a list of family or friends that I can call    Why is this important?   Knowing how and where to find help for yourself or family in your neighborhood and community is an important skill.  You will want to take some steps to learn how.    Notes:         The patient verbalized understanding of instructions, educational materials, and care plan provided today and declined offer to receive copy of patient instructions, educational materials, and care plan.   Telephone follow up appointment with care management team member scheduled for: PRN <20 days Eduard Clos MSW, LCSW Licensed Clinical Social Worker [place] [phone]

## 2021-03-18 NOTE — Chronic Care Management (AMB) (Signed)
Chronic Care Management    Clinical Social Work Note  03/18/2021 Name: Jacqueline Hebert MRN: [id number] DOB: [birth year]  Jacqueline Hebert is a 40 y.o. year old female who is a primary care patient of Jacqueline Salina Real Cons, MD. The CCM team was consulted to assist the patient with chronic disease management and/or care coordination needs related to: [employer]  and Level of Care Concerns.   Engaged with patient by telephone for follow up visit in response to provider referral for social work chronic care management and care coordination services.   Consent to Services:  The patient was given information about Chronic Care Management services, agreed to services, and gave verbal consent prior to initiation of services.  Please see initial visit note for detailed documentation.   Patient agreed to services and consent obtained.   Assessment: Review of patient past medical history, allergies, medications, and health status, including review of relevant consultants reports was performed today as part of a comprehensive evaluation and provision of chronic care management and care coordination services.     SDOH (Social Determinants of Health) assessments and interventions performed:    Advanced Directives Status: Not addressed in this encounter.  CCM Care Plan  No Known Allergies  Outpatient Encounter Medications as of 03/17/2021  Medication Sig   meloxicam (MOBIC) 15 MG tablet TAKE 1 TABLET ONCE DAILY.   No facility-administered encounter medications on file as of 03/17/2021.    Patient Active Problem List   Diagnosis Date Noted   Left hand pain 09/24/2019   Mental impairment 02/05/2015   Routine general medical examination at a health care facility 02/05/2015    Conditions to be addressed/monitored: Dementia and Guardianship; Level of care concerns  Care Plan : LCSW Plan of Care  Updates made by Deirdre Peer, LCSW since 03/18/2021 12:00 AM     Problem: Social and  Functional Symptoms impacting ability for in-home management   Priority: High     Long-Range Goal: Assist with coordination of resource support to enhance patient's Social and Functional well-being   Start Date: 01/14/2021  Expected End Date: 05/29/2021  This Visit's Progress: On track  Recent Progress: On track  Priority: High  Note:   Current Barriers:   Patient's health has declined and longer able to meet ADL's at home Acknowledges deficits with education and support in order to meet this need Unable to independently care for self  Lacks social connections Unable to perform ADLs independently Family/caregiver stress Limited social support, Level of care concerns, Mental Health Concerns , Limited access to caregiver, Cognitive Deficits, Memory Deficits, and Inability to perform ADL's independently Clinical Goal(s): Over the next 30 to 45 days patient will be placed in a facility, patient and family will work with LCSW, to coordinate needs for  level of care  TBD  Placement Interventions: CSW continues to work with pt's sister, [name], to seek long term placement for pt.  Due to pt's history of developmental delay the search/process is challenging but CSW continues to pursue options and share with sister.   Collaboration with [name], MD regarding development and update of comprehensive plan of care as evidenced by provider attestation and co-signature Inter-disciplinary care team collaboration (see longitudinal plan of care) Assessed needs and provided education on level of care and facility placement process Obtained patient's verbal permission to fax FL2 to facilities and obtain PASSAR  Reviewed and initiated process to obtain PASSAR Collaborated with primary care provider for completion of FL2 collaborate with  identified facilities and assist patient as needed to understand facility selection process Assisted patient with faxing FL2 to identified facilities once  completed and signed by PCP Patient interviewed and appropriate assessments performed Discussed plans with patient for ongoing care management follow up and provided patient with direct contact information for care management team Advised sister to inquire and follow up with CAPS/DSS worker regarding additional services/support  Assisted patient/caregiver with obtaining information about health plan benefits Provided education to patient/caregiver regarding level of care options. Patient Goals/Self-Care Activities  -follow up with CAPS/DSS worker for help with increasing hours, options, placement?  - begin a notebook of services in my neighborhood or community - call 211 when I need some help - follow-up on any referrals for help I am given - think ahead to make sure my need does not become an emergency - have a back-up plan - make a list of family or friends that I can call  Over the next 30 days, patient and/or designated caregiver will:   - pursue options for facility placement, in-home options, etc with CAPS/DSS and other  Follow Up Plan: Telephone follow up appointment with care management team member scheduled for: TBD                   Follow Up Plan: SW will follow up with patient by phone over the next 20 days      Eduard Clos MSW, LCSW Licensed Clinical Social Worker [place] [phone]

## 2021-03-25 ENCOUNTER — Ambulatory Visit: Payer: Medicare Other | Admitting: *Deleted

## 2021-03-25 DIAGNOSIS — F79 Unspecified intellectual disabilities: Secondary | ICD-10-CM

## 2021-03-25 NOTE — Patient Instructions (Signed)
Visit Information  PATIENT GOALS:  Goals Addressed   None     The patient verbalized understanding of instructions, educational materials, and care plan provided today and declined offer to receive copy of patient instructions, educational materials, and care plan.   The care management team will reach out to the patient again over the next 10 days.  Eduard Clos MSW, LCSW Licensed Clinical Social Worker [facility] [place] [phone]

## 2021-03-25 NOTE — Chronic Care Management (AMB) (Signed)
Chronic Care Management    Clinical Social Work Note  03/25/2021 Name: Jacqueline Hebert MRN: [id number] DOB: Aug 17, 1980  Jacqueline Hebert is a 40 y.o. year old female who is a primary care patient of Sharlet Salina Real Cons, MD. The CCM team was consulted to assist the patient with chronic disease management and/or care coordination needs related to: Level of Care Concerns.   Engaged with patient by telephone for follow up visit in response to provider referral for social work chronic care management and care coordination services.   Consent to Services:  The patient was given information about Chronic Care Management services, agreed to services, and gave verbal consent prior to initiation of services.  Please see initial visit note for detailed documentation.   Patient agreed to services and consent obtained.   Assessment: Review of patient past medical history, allergies, medications, and health status, including review of relevant consultants reports was performed today as part of a comprehensive evaluation and provision of chronic care management and care coordination services.     SDOH (Social Determinants of Health) assessments and interventions performed:    Advanced Directives Status: Not addressed in this encounter.  CCM Care Plan  No Known Allergies  Outpatient Encounter Medications as of 03/25/2021  Medication Sig   meloxicam (MOBIC) 15 MG tablet TAKE 1 TABLET ONCE DAILY.   No facility-administered encounter medications on file as of 03/25/2021.    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 ; Level of care concerns  Care Plan : LCSW Plan of Care  Updates made by Deirdre Peer, LCSW since 03/25/2021 12:00 AM     Problem: Social and Functional Symptoms impacting ability for  in-home management   Priority: High     Long-Range Goal: Assist with coordination of resource support to enhance patient's Social and Functional well-being   Start Date: 01/14/2021  Expected End Date: 05/29/2021  This Visit's Progress: On track  Recent Progress: On track  Priority: High  Note:   Current Barriers:   Patient's health has declined and longer able to meet ADL's at home Acknowledges deficits with education and support in order to meet this need Unable to independently care for self  Lacks social connections Unable to perform ADLs independently Family/caregiver stress Limited social support, Level of care concerns, Mental Health Concerns , Limited access to caregiver, Cognitive Deficits, Memory Deficits, and Inability to perform ADL's independently Clinical Goal(s): Over the next 30 to 45 days patient will be placed in a facility, patient and family will work with LCSW, to coordinate needs for  level of care  TBD  Placement Interventions: CSW updated pt's sister, [name], of potential progress with seeking appropriate long term placement for pt.  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 Pt's sister to send documents to CSW from her psychological evaluation as a child.  CSW will then collaborate with [facility] SW for further action.    Collaboration with [name], MD regarding development and update of comprehensive plan of care as evidenced by provider attestation and co-signature Inter-disciplinary care team collaboration (see longitudinal plan of care) Assessed needs and provided education on level of care and facility placement process Obtained patient's verbal permission to fax FL2 to facilities and  obtain PASSAR  Reviewed and initiated process to obtain [place] with primary care provider for completion of FL2 collaborate with identified facilities and assist patient as needed to  understand facility selection process Assisted patient with faxing FL2 to identified facilities once completed and signed by PCP Patient interviewed and appropriate assessments performed Discussed plans with patient for ongoing care management follow up and provided patient with direct contact information for care management team Advised sister to inquire and follow up with CAPS/DSS worker regarding additional services/support  Assisted patient/caregiver with obtaining information about health plan benefits Provided education to patient/caregiver regarding level of care options. Patient Goals/Self-Care Activities  -follow up with CAPS/DSS worker for help with increasing hours, options, placement?  - begin a notebook of services in my neighborhood or community - call 211 when I need some help - follow-up on any referrals for help I am given - think ahead to make sure my need does not become an emergency - have a back-up plan - make a list of family or friends that I can call  Over the next 30 days, patient and/or designated caregiver will:   - pursue options for facility placement, in-home options, etc with CAPS/DSS and other  Follow Up Plan: Telephone follow up appointment with care management team member scheduled for: TBD                   Follow Up Plan: Appointment scheduled for SW follow up with client by phone on: TBD      Eduard Clos MSW, [place] Licensed Clinical Social Worker [place] [phone]

## 2021-03-29 ENCOUNTER — Ambulatory Visit: Payer: Medicare Other | Admitting: *Deleted

## 2021-03-29 DIAGNOSIS — F79 Unspecified intellectual disabilities: Secondary | ICD-10-CM

## 2021-03-29 NOTE — Patient Instructions (Signed)
Visit Information  The patient verbalized understanding of instructions, educational materials, and care plan provided today and declined offer to receive copy of patient instructions, educational materials, and care plan.   Telephone follow up appointment with care management team member scheduled for:04/05/21  Eduard Clos MSW, LCSW Licensed Clinical Social Worker [place] [phone]

## 2021-03-29 NOTE — Chronic Care Management (AMB) (Signed)
Chronic Care Management    Clinical Social Work Note  03/29/2021 Name: Jacqueline Hebert MRN: [id number] DOB: Mar 07, 1981  Jacqueline Hebert is a 40 y.o. year old female who is a primary care patient of Sharlet Salina Real Cons, MD. The CCM team was consulted to assist the patient with chronic disease management and/or care coordination needs related to: Level of Care Concerns and Mental Health Counseling and Resources.   Engaged with patient by telephone for follow up visit in response to provider referral for social work chronic care management and care coordination services.   Consent to Services:  The patient was given information about Chronic Care Management services, agreed to services, and gave verbal consent prior to initiation of services.  Please see initial visit note for detailed documentation.   Patient agreed to services and consent obtained.   Assessment: Review of patient past medical history, allergies, medications, and health status, including review of relevant consultants reports was performed today as part of a comprehensive evaluation and provision of chronic care management and care coordination services.     SDOH (Social Determinants of Health) assessments and interventions performed:    Advanced Directives Status: See Care Plan for related entries.  CCM Care Plan  No Known Allergies  Outpatient Encounter Medications as of 03/29/2021  Medication Sig   meloxicam (MOBIC) 15 MG tablet TAKE 1 TABLET ONCE DAILY.   No facility-administered encounter medications on file as of 03/29/2021.    Patient Active Problem List   Diagnosis Date Noted   Left hand pain 09/24/2019   Mental impairment 02/05/2015   Routine general medical examination at a health care facility 02/05/2015    Conditions to be addressed/monitored:  Intellectual delay ; Level of care concerns and Mental Health Concerns   Care Plan : LCSW Plan of Care  Updates made by Deirdre Peer, LCSW since  03/29/2021 12:00 AM     Problem: Social and Functional Symptoms impacting ability for in-home management   Priority: High     Long-Range Goal: Assist with coordination of resource support to enhance patient's Social and Functional well-being   Start Date: 01/14/2021  Expected End Date: 05/29/2021  This Visit's Progress: On track  Recent Progress: On track  Priority: High  Note:   Current Barriers:   Patient's health has declined and longer able to meet ADL's at home Acknowledges deficits with education and support in order to meet this need Unable to independently care for self  Lacks social connections Unable to perform ADLs independently Family/caregiver stress Limited social support, Level of care concerns, Mental Health Concerns , Limited access to caregiver, Cognitive Deficits, Memory Deficits, and Inability to perform ADL's independently Clinical Goal(s): Over the next 30 to 45 days patient will be placed in a facility, patient and family will work with LCSW, to coordinate needs for  level of care  TBD  Placement Interventions: CSW updated pt's sister, Jacqueline Hebert, of next steps for hoping to get pt connected with county assistance for appropriate placement.    Collaboration with [name], MD regarding development and update of comprehensive plan of care as evidenced by provider attestation and co-signature Inter-disciplinary care team collaboration (see longitudinal plan of care) Assessed needs and provided education on level of care and facility placement process Obtained patient's verbal permission to fax FL2 to facilities and obtain PASSAR  Reviewed and initiated process to obtain PASSAR Collaborated with primary care provider for completion of [place] collaborate with identified facilities and assist patient as needed  to understand facility selection process Assisted patient with faxing FL2 to identified facilities once completed and signed by PCP Patient interviewed  and appropriate assessments performed Discussed plans with patient for ongoing care management follow up and provided patient with direct contact information for care management team Advised sister to inquire and follow up with CAPS/DSS worker regarding additional services/support  Assisted patient/caregiver with obtaining information about health plan benefits Provided education to patient/caregiver regarding level of care options. Patient Goals/Self-Care Activities  -call [facility] for IDD Care Coordination -follow up with CAPS/DSS worker for help with increasing hours, options, placement?  - begin a notebook of services in my neighborhood or community - call 211 when I need some help - follow-up on any referrals for help I am given - think ahead to make sure my need does not become an emergency - have a back-up plan - make a list of family or friends that I can call  Over the next 30 days, patient and/or designated caregiver will:   - pursue options for facility placement, in-home options, etc with CAPS/DSS and other  Follow Up Plan: Telephone follow up appointment with care management team member scheduled for: TBD                   Follow Up Plan: SW will follow up with patient by phone over the next 2 weeks      Eduard Clos MSW, LCSW Licensed Clinical Social Worker [place] [phone]

## 2021-04-05 ENCOUNTER — Ambulatory Visit: Payer: Medicare Other | Admitting: *Deleted

## 2021-04-05 DIAGNOSIS — F79 Unspecified intellectual disabilities: Secondary | ICD-10-CM

## 2021-04-05 NOTE — Chronic Care Management (AMB) (Signed)
Chronic Care Management    Clinical Social Work Note  04/05/2021 Name: Jacqueline Hebert MRN: [id number] DOB: June 19, 1980  Jacqueline Hebert is a 40 y.o. year old female who is a primary care patient of Sharlet Salina Real Cons, MD. The CCM team was consulted to assist the patient with chronic disease management and/or care coordination needs related to: [employer]  and Level of Care Concerns.   Engaged with patient by telephone for follow up visit in response to provider referral for social work chronic care management and care coordination services.   Consent to Services:  The patient was given information about Chronic Care Management services, agreed to services, and gave verbal consent prior to initiation of services.  Please see initial visit note for detailed documentation.   Patient agreed to services and consent obtained.   Assessment: Review of patient past medical history, allergies, medications, and health status, including review of relevant consultants reports was performed today as part of a comprehensive evaluation and provision of chronic care management and care coordination services.     SDOH (Social Determinants of Health) assessments and interventions performed:    Advanced Directives Status: See Care Plan for related entries.  CCM Care Plan  No Known Allergies  Outpatient Encounter Medications as of 04/05/2021  Medication Sig   meloxicam (MOBIC) 15 MG tablet TAKE 1 TABLET ONCE DAILY.   No facility-administered encounter medications on file as of 04/05/2021.    Patient Active Problem List   Diagnosis Date Noted   Left hand pain 09/24/2019   Mental impairment 02/05/2015   Routine general medical examination at a health care facility 02/05/2015    Conditions to be addressed/monitored:  mental retardation/autism ; Level of care concerns and Mental Health Concerns   Care Plan : LCSW Plan of Care  Updates made by Deirdre Peer, LCSW since 04/05/2021 12:00 AM      Problem: Social and Functional Symptoms impacting ability for in-home management   Priority: High     Long-Range Goal: Assist with coordination of resource support to enhance patient's Social and Functional well-being   Start Date: 01/14/2021  Expected End Date: 05/29/2021  This Visit's Progress: On track  Recent Progress: On track  Priority: High  Note:   Current Barriers:   Patient's health has declined and longer able to meet ADL's at home Acknowledges deficits with education and support in order to meet this need Unable to independently care for self  Lacks social connections Unable to perform ADLs independently Family/caregiver stress Limited social support, Level of care concerns, Mental Health Concerns , Limited access to caregiver, Cognitive Deficits, Memory Deficits, and Inability to perform ADL's independently Clinical Goal(s): Over the next 30 to 45 days patient will be placed in a facility, patient and family will work with LCSW, to coordinate needs for  level of care  TBD  Placement Interventions: CSW spoke with pt's sister/Guardian, [name], who was able to call and request the Care Coordination support through [place] and will have a Zoom meeting with them Wednesday.  This is the next step to seeking appropriate placement for pt.       Collaboration with [name], MD regarding development and update of comprehensive plan of care as evidenced by provider attestation and co-signature Inter-disciplinary care team collaboration (see longitudinal plan of care) Assessed needs and provided education on level of care and facility placement process Obtained patient's verbal permission to fax FL2 to facilities and obtain PASSAR  Reviewed and initiated process  to obtain PASSAR Collaborated with primary care provider for completion of [place] collaborate with identified facilities and assist patient as needed to understand facility selection process Assisted patient  with faxing FL2 to identified facilities once completed and signed by PCP Patient interviewed and appropriate assessments performed Discussed plans with patient for ongoing care management follow up and provided patient with direct contact information for care management team Advised sister to inquire and follow up with CAPS/DSS worker regarding additional services/support  Assisted patient/caregiver with obtaining information about health plan benefits Provided education to patient/caregiver regarding level of care options. Patient Goals/Self-Care Activities  -call [facility] for IDD Care Coordination -follow up with CAPS/DSS worker for help with increasing hours, options, placement?  - begin a notebook of services in my neighborhood or community - call 211 when I need some help - follow-up on any referrals for help I am given - think ahead to make sure my need does not become an emergency - have a back-up plan - make a list of family or friends that I can call  Over the next 30 days, patient and/or designated caregiver will:   - pursue options for facility placement, in-home options, etc with CAPS/DSS and other  Follow Up Plan: Telephone follow up appointment with care management team member scheduled for: TBD                   Follow Up Plan: SW will follow up with patient by phone over the next 2 weeks       Eduard Clos MSW, LCSW Licensed Clinical Social Worker [place] [phone]

## 2021-04-05 NOTE — Patient Instructions (Signed)
Visit Information  The patient verbalized understanding of instructions, educational materials, and care plan provided today and declined offer to receive copy of patient instructions, educational materials, and care plan.   Telephone follow up appointment with care management team member scheduled for: 2 weeks  Eduard Clos MSW, LCSW Licensed Clinical Social Worker [place] [phone]

## 2021-04-08 ENCOUNTER — Ambulatory Visit (INDEPENDENT_AMBULATORY_CARE_PROVIDER_SITE_OTHER): Payer: Medicare Other

## 2021-04-08 ENCOUNTER — Other Ambulatory Visit: Payer: Self-pay

## 2021-04-08 DIAGNOSIS — Z23 Encounter for immunization: Secondary | ICD-10-CM | POA: Diagnosis not present

## 2021-04-08 NOTE — Progress Notes (Signed)
Pt was given Reg flu vacc w/o any complications.

## 2021-04-09 ENCOUNTER — Telehealth: Payer: Self-pay | Admitting: Internal Medicine

## 2021-04-09 NOTE — Telephone Encounter (Signed)
Type of form received - Handicapped Placard   Form placed in- Provider mailbox  Additional instructions from the patient- Notify by phone when complete   Things to remember: [place] office: If form received in person, remind patient that forms take 7-10 business days CMA should attach charge sheet and put on Supervisor's desk

## 2021-04-12 ENCOUNTER — Ambulatory Visit: Payer: Medicare Other | Admitting: *Deleted

## 2021-04-12 DIAGNOSIS — F79 Unspecified intellectual disabilities: Secondary | ICD-10-CM

## 2021-04-12 NOTE — Patient Instructions (Signed)
Visit Information  (Copy and paste patient goals from clinical care plan here) The patient verbalized understanding of instructions, educational materials, and care plan provided today and declined offer to receive copy of patient instructions, educational materials, and care plan.  The patient verbalized understanding of instructions, educational materials, and care plan provided today and declined offer to receive copy of patient instructions, educational materials, and care plan.   Telephone follow up appointment with care management team member scheduled for:05/11/21  Eduard Clos MSW, LCSW Licensed Clinical Social Worker [place] [phone]

## 2021-04-12 NOTE — Chronic Care Management (AMB) (Signed)
Chronic Care Management    Clinical Social Work Note  04/12/2021 Name: Jacqueline Hebert MRN: [id number] DOB: July 20, 1980  Jacqueline Hebert is a 40 y.o. year old female who is a primary care patient of Jacqueline Salina Real Cons, MD. The CCM team was consulted to assist the patient with chronic disease management and/or care coordination needs related to: [employer]  and Level of Care Concerns.   Engaged with patient by telephone for follow up visit in response to provider referral for social work chronic care management and care coordination services.   Consent to Services:  The patient was given information about Chronic Care Management services, agreed to services, and gave verbal consent prior to initiation of services.  Please see initial visit note for detailed documentation.   Patient agreed to services and consent obtained.   Assessment: Review of patient past medical history, allergies, medications, and health status, including review of relevant consultants reports was performed today as part of a comprehensive evaluation and provision of chronic care management and care coordination services.     SDOH (Social Determinants of Health) assessments and interventions performed:    Advanced Directives Status: See Care Plan for related entries.  CCM Care Plan  No Known Allergies  Outpatient Encounter Medications as of 04/12/2021  Medication Sig   meloxicam (MOBIC) 15 MG tablet TAKE 1 TABLET ONCE DAILY.   No facility-administered encounter medications on file as of 04/12/2021.    Patient Active Problem List   Diagnosis Date Noted   Left hand pain 09/24/2019   Mental impairment 02/05/2015   Routine general medical examination at a health care facility 02/05/2015    Conditions to be addressed/monitored: Dementia; Level of care concerns  Care Plan : LCSW Plan of Care  Updates made by Jacqueline Peer, LCSW since 04/12/2021 12:00 AM     Problem: Social and Functional Symptoms  impacting ability for in-home management   Priority: High     Long-Range Goal: Assist with coordination of resource support to enhance patient's Social and Functional well-being   Start Date: 01/14/2021  Expected End Date: 06/28/2021  This Visit's Progress: On track  Recent Progress: On track  Priority: High  Note:   Current Barriers:   Patient's health has declined and longer able to meet ADL's at home Acknowledges deficits with education and support in order to meet this need Unable to independently care for self  Lacks social connections Unable to perform ADLs independently Family/caregiver stress Limited social support, Level of care concerns, Mental Health Concerns , Limited access to caregiver, Cognitive Deficits, Memory Deficits, and Inability to perform ADL's independently Clinical Goal(s): Over the next 30 to 45 days patient will be placed in a facility, patient and family will work with LCSW, to coordinate needs for  level of care  TBD  Placement Interventions: CSW spoke with pt's sister/Guardian, Jacqueline Hebert, who was able to meet with the IDD Care Coordinator through [place]. An application has been taken for permanent housing/group home placement for [name]. Sister states she expects an update from them later this week but was advised of a waiting period/wait list.   Per sister, their mother is under Hospice care at the SNF- CSW offered emotional support and encouraged Jacqueline Hebert to spend as much time as she can and wants with her mother in these difficult times- she anticipates siblings coming soon to visit.   Collaboration with Jacqueline Koch, MD regarding development and update of comprehensive plan of care as evidenced by provider attestation  and co-signature Inter-disciplinary care team collaboration (see longitudinal plan of care) Assessed needs and provided education on level of care and facility placement process Obtained patient's verbal permission to fax FL2 to facilities and  obtain PASSAR  Reviewed and initiated process to obtain [place] with primary care provider for completion of [place] collaborate with identified facilities and assist patient as needed to understand facility selection process Assisted patient with faxing FL2 to identified facilities once completed and signed by PCP Patient interviewed and appropriate assessments performed Discussed plans with patient for ongoing care management follow up and provided patient with direct contact information for care management team Advised sister to inquire and follow up with CAPS/DSS worker regarding additional services/support  Assisted patient/caregiver with obtaining information about health plan benefits Provided education to patient/caregiver regarding level of care options. Patient Goals/Self-Care Activities   -await follow up from [place] -follow up with CAPS/DSS worker for help with increasing hours, options, placement?  - begin a notebook of services in my neighborhood or community - call 211 when I need some help - follow-up on any referrals for help I am given - think ahead to make sure my need does not become an emergency - have a back-up plan Over the next 30 days, patient and/or designated caregiver will:   - pursue options for facility placement, in-home options, etc with CAPS/DSS and other  Follow Up Plan: Telephone follow up appointment with care management team member scheduled for: 05/11/21       Follow Up Plan: Appointment scheduled for SW follow up with client by phone on: 05/11/21      Jacqueline Hebert MSW, [place] Licensed Clinical Social Worker [place] [phone]

## 2021-04-12 NOTE — Telephone Encounter (Signed)
Noted  

## 2021-05-05 ENCOUNTER — Other Ambulatory Visit: Payer: Self-pay | Admitting: Internal Medicine

## 2021-05-11 ENCOUNTER — Encounter: Payer: Self-pay | Admitting: Internal Medicine

## 2021-05-11 ENCOUNTER — Telehealth: Payer: Self-pay | Admitting: Internal Medicine

## 2021-05-11 ENCOUNTER — Other Ambulatory Visit: Payer: Self-pay

## 2021-05-11 ENCOUNTER — Ambulatory Visit (INDEPENDENT_AMBULATORY_CARE_PROVIDER_SITE_OTHER): Payer: Medicare Other | Admitting: Internal Medicine

## 2021-05-11 DIAGNOSIS — F79 Unspecified intellectual disabilities: Secondary | ICD-10-CM | POA: Diagnosis not present

## 2021-05-11 MED ORDER — MELOXICAM 15 MG PO TABS: 15.0000 mg | ORAL_TABLET | Freq: Every day | ORAL | 11 refills | Status: AC

## 2021-05-11 NOTE — Patient Instructions (Signed)
We have filled out the forms and given you the prescription for the meloxicam.

## 2021-05-11 NOTE — Telephone Encounter (Signed)
Jacqueline Hebert from [facility] calling in  [place] they received level of care form that was filled out by provider  Jacqueline Hebert says there is a error in Section 6 that needs to be corrected.. needs provider to spell out name w/ credentials & to make the date signed more easier to read

## 2021-05-11 NOTE — Progress Notes (Signed)
° °  Subjective:   Patient ID: Jacqueline Hebert, female    DOB: May 23, 1981, 40 y.o.   MRN: [id number]  HPI The patient is a 40 YO female with sister (guardian) needing forms for level of care.   Review of Systems  Unable to perform ROS: Patient nonverbal   Objective:  Physical Exam Constitutional:      Appearance: She is well-developed.  HENT:     Head: Normocephalic and atraumatic.  Cardiovascular:     Rate and Rhythm: Normal rate and regular rhythm.  Pulmonary:     Effort: Pulmonary effort is normal. No respiratory distress.     Breath sounds: Normal breath sounds. No wheezing or rales.  Abdominal:     General: Bowel sounds are normal. There is no distension.     Palpations: Abdomen is soft.     Tenderness: There is no abdominal tenderness. There is no rebound.  Musculoskeletal:     Cervical back: Normal range of motion.  Skin:    General: Skin is warm and dry.  Neurological:     Mental Status: She is alert. Mental status is at baseline.     Coordination: Coordination normal.    Vitals:   05/11/21 1554  BP: 112/70  Pulse: (!) 50  Resp: 18  SpO2: 98%  Weight: 95 lb 3.2 oz (43.2 kg)  Height: 5\' 3"  (1.6 m)    This visit occurred during the SARS-CoV-2 public health emergency.  Safety protocols were in place, including screening questions prior to the visit, additional usage of staff PPE, and extensive cleaning of exam room while observing appropriate contact time as indicated for disinfecting solutions.   Assessment & Plan:  Visit time 15 minutes in face to face communication with patient and coordination of care, additional 10 minutes spent in record review, coordination or care, ordering tests, communicating/referring to other healthcare professionals, documenting in medical records all on the same day of the visit for total time 25 minutes spent on the visit.

## 2021-05-12 NOTE — Telephone Encounter (Signed)
Did you call about this?

## 2021-05-12 NOTE — Telephone Encounter (Signed)
Caller Melvia Heaps checking status of request   Caller states Venetia Constable is requesting form today before 12pm  *see below*

## 2021-05-12 NOTE — Telephone Encounter (Signed)
Spoke with Jacqueline Hebert at [facility] and she was able to tell me what adjustments needed to be made. Dr.Crawford has made the adjustments and paperwork has been scanned to [facility] e-mail provided on the form.   Ms. Jacqueline Hebert has been updated and she was very appreciative for the fast update. No other questions or concerns.

## 2021-05-14 ENCOUNTER — Encounter: Payer: Self-pay | Admitting: Internal Medicine

## 2021-05-14 NOTE — Assessment & Plan Note (Signed)
Forms filled out for her to help her get into group home and will have more PT/OT/SLP.

## 2021-05-18 ENCOUNTER — Ambulatory Visit: Payer: Medicare Other | Admitting: *Deleted

## 2021-05-18 NOTE — Chronic Care Management (AMB) (Signed)
Chronic Care Management    Clinical Social Work Note  05/18/2021 Name: Jacqueline Hebert MRN: [id number] DOB: 1980-06-12  Jacqueline Hebert is a 40 y.o. year old female who is a primary care patient of Sharlet Salina Real Cons, MD. The CCM team was consulted to assist the patient with chronic disease management and/or care coordination needs related to: Level of Care Concerns.   Engaged with patient by telephone for follow up visit in response to provider referral for social work chronic care management and care coordination services.   Consent to Services:  The patient was given information about Chronic Care Management services, agreed to services, and gave verbal consent prior to initiation of services.  Please see initial visit note for detailed documentation.   Patient agreed to services and consent obtained.   Assessment: Review of patient past medical history, allergies, medications, and health status, including review of relevant consultants reports was performed today as part of a comprehensive evaluation and provision of chronic care management and care coordination services.     SDOH (Social Determinants of Health) assessments and interventions performed:    Advanced Directives Status: See Care Plan for related entries.  CCM Care Plan  No Known Allergies  Outpatient Encounter Medications as of 05/18/2021  Medication Sig   meloxicam (MOBIC) 15 MG tablet Take 1 tablet (15 mg total) by mouth daily.   No facility-administered encounter medications on file as of 05/18/2021.    Patient Active Problem List   Diagnosis Date Noted   Left hand pain 09/24/2019   Mental impairment 02/05/2015   Routine general medical examination at a health care facility 02/05/2015    Conditions to be addressed/monitored: Dementia; Level of care concerns  Care Plan : LCSW Plan of Care  Updates made by Deirdre Peer, LCSW since 05/18/2021 12:00 AM     Problem: Social and Functional Symptoms  impacting ability for in-home management   Priority: High     Long-Range Goal: Assist with coordination of resource support to enhance patient's Social and Functional well-being Completed 05/18/2021  Start Date: 01/14/2021  Expected End Date: 06/28/2021  This Visit's Progress: On track  Recent Progress: On track  Priority: High  Note:   Current Barriers:   Patient's health has declined and longer able to meet ADL's at home Acknowledges deficits with education and support in order to meet this need Unable to independently care for self  Lacks social connections Unable to perform ADLs independently Family/caregiver stress Limited social support, Level of care concerns, Mental Health Concerns , Limited access to caregiver, Cognitive Deficits, Memory Deficits, and Inability to perform ADL's independently Clinical Goal(s): Over the next 30 to 45 days patient will be placed in a facility, patient and family will work with LCSW, to coordinate needs for  level of care  TBD  Placement Interventions: 05/18/21 CSW spoke with pt's sister/Guardian, [name], who shared pt has been admitted to [facility] in [place], [place]. She also shared that their mother passed away- support offered in relation to her loss.  Moved last Friday and has "taken a liking to the people there". It has a 59 female residents ratio to 2 staff.  Sister appreciative the help provided to locate the appropriate setting for [facility]. CSW will sign off.    Pt was able to meet with the IDD Care Coordinator through [place]. An application has been taken for permanent housing/group home placement for [facility]. Sister states she expects an update from them later this week but  was advised of a waiting period/wait list.   Per sister, their mother is under Hospice care at the SNF- CSW offered emotional support and encouraged [name] to spend as much time as she can and wants with her mother in these difficult times- she anticipates siblings coming  soon to visit.   Collaboration with [name], MD regarding development and update of comprehensive plan of care as evidenced by provider attestation and co-signature Inter-disciplinary care team collaboration (see longitudinal plan of care) Assessed needs and provided education on level of care and facility placement process Obtained patient's verbal permission to fax FL2 to facilities and obtain PASSAR  Reviewed and initiated process to obtain [place] with primary care provider for completion of [place] collaborate with identified facilities and assist patient as needed to understand facility selection process Assisted patient with faxing FL2 to identified facilities once completed and signed by PCP Patient interviewed and appropriate assessments performed Discussed plans with patient for ongoing care management follow up and provided patient with direct contact information for care management team Advised sister to inquire and follow up with CAPS/DSS worker regarding additional services/support  Assisted patient/caregiver with obtaining information about health plan benefits Provided education to patient/caregiver regarding level of care options. Patient Goals/Self-Care Activities   -await follow up from [place] -follow up with CAPS/DSS worker for help with increasing hours, options, placement?  - begin a notebook of services in my neighborhood or community - call 211 when I need some help - follow-up on any referrals for help I am given - think ahead to make sure my need does not become an emergency - have a back-up plan Over the next 30 days, patient and/or designated caregiver will:   - pursue options for facility placement, in-home options, etc with CAPS/DSS and other  Follow Up Plan: N/A                   Follow Up Plan: N/A     Eduard Clos MSW, LCSW Licensed Clinical Social Worker [place] [phone]

## 2021-09-07 ENCOUNTER — Telehealth: Payer: Self-pay | Admitting: Internal Medicine

## 2021-09-07 NOTE — Telephone Encounter (Signed)
Called and is requesting a D/C order from MD.  ? ?Please send over to [facility] Fax #- [phone] ?

## 2022-01-19 ENCOUNTER — Telehealth: Payer: Self-pay | Admitting: Internal Medicine

## 2022-01-19 NOTE — Telephone Encounter (Signed)
LVM for pt to rtn my call to schedule AWV with [facility] call back # [phone] 

## 2022-04-29 ENCOUNTER — Telehealth: Payer: Self-pay

## 2022-04-29 NOTE — Telephone Encounter (Signed)
Spoke with patient aunt who is the patient current legal guardian due to patient having special needs. Aunt states that she will give me a call back to do the follow up.

## 2022-04-29 NOTE — Telephone Encounter (Signed)
Patient aunt and current legal guardian called back. She said she is available any time except Monday for a call back , the best number is [phone].

## 2022-04-29 NOTE — Telephone Encounter (Signed)
Spoke With patient aunt who is her legal guardian. Patient is currently in [place] and patient has special needs. Patients aunt will give me a call back when appropriate to speak

## 2022-05-02 ENCOUNTER — Telehealth: Payer: Self-pay

## 2022-05-02 NOTE — Telephone Encounter (Signed)
Transition Care Management Unsuccessful Follow-up Telephone Call  Date of discharge and from where:  04/28/2022  Attempts:  1st Attempt  Reason for unsuccessful TCM follow-up call:  Voice mail full

## 2022-11-03 ENCOUNTER — Telehealth: Payer: Self-pay

## 2022-11-03 NOTE — Transitions of Care (Post Inpatient/ED Visit) (Signed)
   11/03/2022  Name: Jacqueline Hebert MRN: [id number] DOB: 08-15-1980  Pt is living in group home in [place] and has PCP in facility- opened in error    Woodfin Ganja LPN [facility] Nurse Health Advisor Direct Dial [phone]

## 2022-11-03 NOTE — Transitions of Care (Post Inpatient/ED Visit) (Signed)
   11/03/2022  Name: Jacqueline Hebert MRN: [id number] DOB: 1980-11-06  Today's TOC FU Call Status:    Attempted to reach the patient regarding the most recent Inpatient/ED visit.  Follow Up Plan: Additional outreach attempts will be made to reach the patient to complete the Transitions of Care (Post Inpatient/ED visit) call.   Signature  Woodfin Ganja LPN [facility] Nurse Health Advisor Direct Dial [phone]

## 2022-11-03 NOTE — Transitions of Care (Post Inpatient/ED Visit) (Signed)
   11/03/2022  Name: Jacqueline Hebert MRN: [id number] DOB: [birth year]  Opened in error
